# Patient Record
Sex: Male | Born: 1995 | Race: Black or African American | Hispanic: No | Marital: Single | Smoking: Current every day smoker
Health system: Southern US, Community
[De-identification: ages and names within clinical notes are randomized; demographics above are authoritative.]

## PROBLEM LIST (undated history)

## (undated) ENCOUNTER — Emergency Department (HOSPITAL_BASED_OUTPATIENT_CLINIC_OR_DEPARTMENT_OTHER): Admission: EM | Source: Home / Self Care

## (undated) DIAGNOSIS — F32A Depression, unspecified: Secondary | ICD-10-CM

## (undated) DIAGNOSIS — M9250 Juvenile osteochondrosis of tibia and fibula, unspecified leg: Secondary | ICD-10-CM

## (undated) DIAGNOSIS — M25569 Pain in unspecified knee: Secondary | ICD-10-CM

## (undated) DIAGNOSIS — F329 Major depressive disorder, single episode, unspecified: Secondary | ICD-10-CM

## (undated) DIAGNOSIS — F909 Attention-deficit hyperactivity disorder, unspecified type: Secondary | ICD-10-CM

## (undated) HISTORY — DX: Juvenile osteochondrosis of tibia and fibula, unspecified leg: M92.50

## (undated) HISTORY — DX: Major depressive disorder, single episode, unspecified: F32.9

## (undated) HISTORY — DX: Attention-deficit hyperactivity disorder, unspecified type: F90.9

## (undated) HISTORY — DX: Pain in unspecified knee: M25.569

## (undated) HISTORY — DX: Depression, unspecified: F32.A

---
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
## 2017-03-26 ENCOUNTER — Emergency Department (HOSPITAL_COMMUNITY)
Admission: EM | Admit: 2017-03-26 | Discharge: 2017-03-26 | Disposition: A | Discharge status: Home / Self Care | Payer: Medicaid Other | Attending: Emergency Medicine | Admitting: Emergency Medicine

## 2017-03-26 ENCOUNTER — Encounter (HOSPITAL_COMMUNITY): Payer: Self-pay | Admitting: *Deleted

## 2017-03-26 ENCOUNTER — Telehealth: Payer: Self-pay | Admitting: Internal Medicine

## 2017-03-26 DIAGNOSIS — F17200 Nicotine dependence, unspecified, uncomplicated: Secondary | ICD-10-CM | POA: Insufficient documentation

## 2017-03-26 DIAGNOSIS — R112 Nausea with vomiting, unspecified: Secondary | ICD-10-CM | POA: Diagnosis not present

## 2017-03-26 DIAGNOSIS — R197 Diarrhea, unspecified: Secondary | ICD-10-CM | POA: Insufficient documentation

## 2017-03-26 DIAGNOSIS — R109 Unspecified abdominal pain: Secondary | ICD-10-CM | POA: Diagnosis present

## 2017-03-26 LAB — COMPREHENSIVE METABOLIC PANEL
ALBUMIN: 4.6 g/dL (ref 3.5–5.0)
ALT: 15 U/L — AB (ref 17–63)
AST: 20 U/L (ref 15–41)
Alkaline Phosphatase: 47 U/L (ref 38–126)
Anion gap: 11 (ref 5–15)
BUN: 12 mg/dL (ref 6–20)
CHLORIDE: 103 mmol/L (ref 101–111)
CO2: 24 mmol/L (ref 22–32)
CREATININE: 1.08 mg/dL (ref 0.61–1.24)
Calcium: 9.6 mg/dL (ref 8.9–10.3)
GFR calc Af Amer: >60 mL/min (ref >60)
GLUCOSE: 107 mg/dL — AB (ref 65–99)
POTASSIUM: 3.5 mmol/L (ref 3.5–5.1)
SODIUM: 138 mmol/L (ref 135–145)
Total Bilirubin: 1.6 mg/dL — ABNORMAL HIGH (ref 0.3–1.2)
Total Protein: 7.9 g/dL (ref 6.5–8.1)

## 2017-03-26 LAB — URINALYSIS, ROUTINE W REFLEX MICROSCOPIC
Bilirubin Urine: NEGATIVE
GLUCOSE, UA: NEGATIVE mg/dL
HGB URINE DIPSTICK: NEGATIVE
KETONES UR: 5 mg/dL — AB
LEUKOCYTES UA: NEGATIVE
Nitrite: NEGATIVE
Protein, ur: NEGATIVE mg/dL
Specific Gravity, Urine: 1.028 (ref 1.005–1.030)
pH: 5 (ref 5.0–8.0)

## 2017-03-26 LAB — CBC
HEMATOCRIT: 46.7 % (ref 39.0–52.0)
Hemoglobin: 15.5 g/dL (ref 13.0–17.0)
MCH: 27.6 pg (ref 26.0–34.0)
MCHC: 33.2 g/dL (ref 30.0–36.0)
MCV: 83.2 fL (ref 78.0–100.0)
Platelets: 241 10*3/uL (ref 150–400)
RBC: 5.61 MIL/uL (ref 4.22–5.81)
RDW: 13.5 % (ref 11.5–15.5)
WBC: 10.2 10*3/uL (ref 4.0–10.5)

## 2017-03-26 LAB — LIPASE, BLOOD: LIPASE: 25 U/L (ref 11–51)

## 2017-03-26 MED ORDER — SODIUM CHLORIDE 0.9 % IV BOLUS (SEPSIS)
1000.0000 mL | Freq: Once | INTRAVENOUS | Status: AC
  Administered 2017-03-26: 1000 mL via INTRAVENOUS

## 2017-03-26 MED ORDER — ONDANSETRON HCL 4 MG PO TABS: 4.0000 mg | ORAL_TABLET | Freq: Three times a day (TID) | ORAL | 0 refills | Status: DC | PRN

## 2017-03-26 MED ORDER — ONDANSETRON HCL 4 MG/2ML IJ SOLN
4.0000 mg | Freq: Once | INTRAMUSCULAR | Status: AC
  Administered 2017-03-26: 4 mg via INTRAVENOUS
  Filled 2017-03-26: qty 2

## 2017-03-26 NOTE — Telephone Encounter (Signed)
Mother is calling because her son was seen in the ER early this morning. He was not given any type of medication for nausea when he left. He is still having the same symptoms and the mom wanted to know if we can call in something for this. jw

## 2017-03-26 NOTE — ED Triage Notes (Signed)
The pt is c/o abd pain nausea vomiting  Diarrhea all day.

## 2017-03-26 NOTE — Telephone Encounter (Signed)
Please let patient know I have sent in some Zofran to help with Nausea. Make sure patient is able to keep down fluids as this is most important. Patient can follow up with me as needed for this issue

## 2017-03-26 NOTE — ED Provider Notes (Signed)
[facility]-EMERGENCY DEPT Provider Note   CSN: [id number] Arrival date & time: 03/26/17  1914  By signing my name below, I, Rosana Fret, attest that this documentation has been prepared under the direction and in the presence of Melene Plan, DO. Electronically Signed: Rosana Fret, ED Scribe. 03/26/17. 2:22 AM.  History   Chief Complaint Chief Complaint  Patient presents with  . Abdominal Pain   The history is provided by the patient. No language interpreter was used.   HPI Comments: Jason Benjamin is an otherwise 21 y.o. male who presents to the Emergency Department complaining of generalized, moderate abdominal pain onset 16 hours ago. Pt describes pain as radiating to his mid back. Pt reports associated nausea, vomiting, diarrhea, subjective fever and chills. No treatments tried prior to arrival in the ED. Pt denies melena, blood in stool, dysuria, hematemesis or any other complaints at this time.  Past Medical History:  Diagnosis Date  . ADHD (attention deficit hyperactivity disorder)   . Depression   . Knee pain, acute 03/27/2011   Treated at [facility] with PT 7/5-9/19/12   . Osgood-Schlatter's disease 05/15/2011    Patient Active Problem List   Diagnosis Date Noted  . Viral syndrome 04/30/2014  . Cough 04/30/2014  . Foot callus 03/26/2014  . Dysuria 03/26/2014  . Lesion of right earlobe 10/23/2013  . Allergic rhinitis 01/22/2013  . Obesity 06/09/2012  . 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 Adjustment disorder with anxiety 11/25/2010  . Depressive disorder 09/09/2010  . 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 Microscopic hematuria 06/19/2008    History reviewed. No pertinent surgical history.     Home Medications    Prior to Admission medications   Medication Sig Start Date End Date Taking? Authorizing Provider  acyclovir (ZOVIRAX) 400 MG tablet Take 1  tablet (400 mg total) by mouth 3 (three) times daily. 12/24/16   Deatra Canter, FNP    Family History Family History  Problem Relation Age of Onset  . Hypertension Mother     Social History Social History  Substance Use Topics  . Smoking status: Current Every Day Smoker  . Smokeless tobacco: Never Used  . Alcohol use No     Allergies   Patient has no known allergies.   Review of Systems Review of Systems  Constitutional: Positive for chills and fever (subjective).  HENT: Negative for congestion and facial swelling.   Eyes: Negative for discharge and visual disturbance.  Respiratory: Negative for shortness of breath.   Cardiovascular: Negative for chest pain and palpitations.  Gastrointestinal: Positive for abdominal pain, diarrhea, nausea and vomiting. Negative for blood in stool.  Genitourinary: Negative for dysuria.  Musculoskeletal: Negative for arthralgias and myalgias.  Skin: Negative for color change and rash.  Neurological: Negative for tremors, syncope and headaches.  Psychiatric/Behavioral: Negative for confusion and dysphoric mood.     Physical Exam Updated Vital Signs BP 122/69   Pulse 82   Temp 98.6 F (37 C) (Oral)   Resp 16   Ht 6\' 5"  (1.956 m)   Wt 104.3 kg (230 lb)   SpO2 99%   BMI 27.27 kg/m   Physical Exam  Constitutional: He is oriented to person, place, and time. He appears well-developed and well-nourished.  HENT:  Head: Normocephalic and atraumatic.  Eyes: EOM are normal. Pupils are equal, round, and reactive to light.  Neck: Normal range of motion. Neck supple. No JVD present.  Cardiovascular: Normal rate and regular rhythm.  Exam reveals no gallop and no friction rub.   No murmur heard. Pulmonary/Chest: No respiratory distress. He has no wheezes.  Benign abdominal exam.   Abdominal: Soft. He exhibits no distension. There is no rebound and no guarding.  Musculoskeletal: Normal range of motion.  Neurological: He is alert and  oriented to person, place, and time.  Skin: No rash noted. No pallor.  Psychiatric: He has a normal mood and affect. His behavior is normal.  Nursing note and vitals reviewed.    ED Treatments / Results  DIAGNOSTIC STUDIES: Oxygen Saturation is 100% on RA, normal by my interpretation.   COORDINATION OF CARE: 2:19 AM-Discussed next steps with pt including medicine for nausea. Pt verbalized understanding and is agreeable with the plan.   Labs (all labs ordered are listed, but only abnormal results are displayed) Labs Reviewed  COMPREHENSIVE METABOLIC PANEL - Abnormal; Notable for the following:       Result Value   Glucose, Bld 107 (*)    ALT 15 (*)    Total Bilirubin 1.6 (*)    All other components within normal limits  URINALYSIS, ROUTINE W REFLEX MICROSCOPIC - Abnormal; Notable for the following:    Ketones, ur 5 (*)    All other components within normal limits  LIPASE, BLOOD  CBC    EKG  EKG Interpretation None       Radiology No results found.  Procedures Procedures (including critical care time)  Medications Ordered in ED Medications  sodium chloride 0.9 % bolus 1,000 mL (0 mLs Intravenous Stopped 03/26/17 0243)  ondansetron (ZOFRAN) injection 4 mg (4 mg Intravenous Given 03/26/17 0207)     Initial Impression / Assessment and Plan / ED Course  I have reviewed the triage vital signs and the nursing notes.  Pertinent labs & imaging results that were available during my care of the patient were reviewed by me and considered in my medical decision making (see chart for details).     21 yo M With a chief complaint of nausea vomiting and diarrhea. No abdominal tenderness on exam. Well-appearing and nontoxic. Labs are assured. Discharge home.  5:24 AM:  I have discussed the diagnosis/risks/treatment options with the patient and family and believe the pt to be eligible for discharge home to follow-up with PCP. We also discussed returning to the ED immediately if  new or worsening sx occur. We discussed the sx which are most concerning (e.g., sudden worsening pain, fever, inability to tolerate by mouth) that necessitate immediate return. Medications administered to the patient during their visit and any new prescriptions provided to the patient are listed below.  Medications given during this visit Medications  sodium chloride 0.9 % bolus 1,000 mL (0 mLs Intravenous Stopped 03/26/17 0243)  ondansetron (ZOFRAN) injection 4 mg (4 mg Intravenous Given 03/26/17 0207)     The patient appears reasonably screen and/or stabilized for discharge and I doubt any other medical condition or other [facility] requiring further screening, evaluation, or treatment in the ED at this time prior to discharge.    Final Clinical Impressions(s) / ED Diagnoses   Final diagnoses:  Nausea vomiting and diarrhea    New Prescriptions Discharge Medication List as of 03/26/2017  2:23 AM      I personally performed the services described in this documentation, which was scribed in my presence. The recorded information has been reviewed and is accurate.     Melene PlanFloyd, Jeson Camacho, DO 03/26/17 [phone]

## 2017-03-26 NOTE — Telephone Encounter (Signed)
Patient mother informed of message from MD, expressed understanding. 

## 2017-03-26 NOTE — Discharge Instructions (Signed)
Imodium for diarrhea

## 2017-05-23 ENCOUNTER — Emergency Department (HOSPITAL_COMMUNITY)
Admission: EM | Admit: 2017-05-23 | Discharge: 2017-05-24 | Disposition: A | Discharge status: Home / Self Care | Payer: Medicaid Other | Attending: Emergency Medicine | Admitting: Emergency Medicine

## 2017-05-23 ENCOUNTER — Encounter (HOSPITAL_COMMUNITY): Payer: Self-pay | Admitting: *Deleted

## 2017-05-23 ENCOUNTER — Emergency Department (HOSPITAL_COMMUNITY): Payer: Medicaid Other

## 2017-05-23 DIAGNOSIS — L03116 Cellulitis of left lower limb: Secondary | ICD-10-CM | POA: Insufficient documentation

## 2017-05-23 DIAGNOSIS — F17200 Nicotine dependence, unspecified, uncomplicated: Secondary | ICD-10-CM | POA: Insufficient documentation

## 2017-05-23 DIAGNOSIS — L02419 Cutaneous abscess of limb, unspecified: Secondary | ICD-10-CM

## 2017-05-23 DIAGNOSIS — L03119 Cellulitis of unspecified part of limb: Secondary | ICD-10-CM

## 2017-05-23 DIAGNOSIS — F902 Attention-deficit hyperactivity disorder, combined type: Secondary | ICD-10-CM | POA: Insufficient documentation

## 2017-05-23 DIAGNOSIS — Z79899 Other long term (current) drug therapy: Secondary | ICD-10-CM | POA: Diagnosis not present

## 2017-05-23 DIAGNOSIS — M25562 Pain in left knee: Secondary | ICD-10-CM

## 2017-05-23 MED ORDER — LIDOCAINE-EPINEPHRINE (PF) 2 %-1:200000 IJ SOLN
10.0000 mL | Freq: Once | INTRAMUSCULAR | Status: AC
  Administered 2017-05-23: 10 mL
  Filled 2017-05-23: qty 20

## 2017-05-23 MED ORDER — IBUPROFEN 800 MG PO TABS
800.0000 mg | ORAL_TABLET | Freq: Once | ORAL | Status: AC
  Administered 2017-05-23: 800 mg via ORAL
  Filled 2017-05-23: qty 1

## 2017-05-23 NOTE — ED Provider Notes (Signed)
[facility]-EMERGENCY DEPT Provider Note   CSN: [id number] Arrival date & time: 05/23/17  2107  By signing my name below, I, [name], attest that this documentation has been prepared under the direction and in the presence of Will Koralyn Prestage, PA-C. Electronically Signed: [name], Scribe. 05/23/2017. 11:06 PM.  History   Chief Complaint Chief Complaint  Patient presents with  . Leg Pain  . Abscess  . Knee Pain   The history is provided by the patient. No language interpreter was used.   HPI Comments: Jason KeysJayquan Benjamin is a 21 y.o. male who presents to the Emergency Department for evaluation of persistent anterior left knee pain beginning around 1130 AM this morning. The pain is worse with flexion and extension of the knee. No medications or treatments tried. No recent trauma or injury to his left knee. He has a history of tendonitis in his bilateral knees when he played football, but notes that he stopped playing after he was diagnosed. His current knee pain is completely different than previous pain related to tendonitis. Patient denies fevers, chills, numbness/tingling, focal weakness, calf pain, leg swelling, or any other associated symptoms.  He is secondarily complaining of a moderate, gradually worsening area of pain and swelling to his right lateral thigh ongoing for several days. The pain is worse with palpation. There are no alleviating factors noted. He has no h/o skin abscesses. He denies known injury or insect bite. His tetanus status is UTD. Patient denies spontaneous drainage from the area or any other associated symptoms.  Past Medical History:  Diagnosis Date  . ADHD (attention deficit hyperactivity disorder)   . Depression   . Knee pain, acute 03/27/2011   Treated at [facility] with PT 7/5-9/19/12   . Osgood-Schlatter's disease 05/15/2011    Patient Active Problem List   Diagnosis Date Noted  . Viral syndrome 04/30/2014  . Cough 04/30/2014  . Foot callus  03/26/2014  . Dysuria 03/26/2014  . Lesion of right earlobe 10/23/2013  . Allergic rhinitis 01/22/2013  . Obesity 06/09/2012  . 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 Adjustment disorder with anxiety 11/25/2010  . Depressive disorder 09/09/2010  . 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 Microscopic hematuria 06/19/2008    History reviewed. No pertinent surgical history.     Home Medications    Prior to Admission medications   Medication Sig Start Date End Date Taking? Authorizing Provider  acyclovir (ZOVIRAX) 400 MG tablet Take 1 tablet (400 mg total) by mouth 3 (three) times daily. 12/24/16   Deatra Canterxford, Tobey Schmelzle J, FNP  cephALEXin (KEFLEX) 500 MG capsule Take 1 capsule (500 mg total) by mouth 2 (two) times daily. 05/24/17   Everlene Farrieransie, Christol Thetford, PA-C  naproxen (NAPROSYN) 250 MG tablet Take 1 tablet (250 mg total) by mouth 2 (two) times daily with a meal. 05/24/17   Everlene Farrieransie, Garrit Marrow, PA-C  ondansetron (ZOFRAN) 4 MG tablet Take 1 tablet (4 mg total) by mouth every 8 (eight) hours as needed for nausea or vomiting. 03/26/17   Cathlean CowerMikell, Antionette PolesAsiyah Zahra, MD    Family History Family History  Problem Relation Age of Onset  . Hypertension Mother     Social History Social History  Substance Use Topics  . Smoking status: Current Every Day Smoker  . Smokeless tobacco: Never Used  . Alcohol use No     Allergies   Patient has no known allergies.   Review of Systems Review of Systems  Constitutional: Negative for chills and fever.  Cardiovascular: Negative for leg swelling.  Musculoskeletal: Positive for arthralgias. Negative for back pain, joint swelling and myalgias.  Skin: Positive for color change and rash.  Neurological: Negative for weakness and numbness.   Physical Exam Updated Vital Signs BP 133/70 (BP Location: Left Arm)   Pulse 88   Temp 98.8 F (37.1 C) (Oral)   Resp 18   Ht 6'  5" (1.956 m)   Wt 104.3 kg (230 lb)   SpO2 100%   BMI 27.27 kg/m   Physical Exam  Constitutional: He appears well-developed and well-nourished. No distress.  Nontoxic-appearing.  HENT:  Head: Normocephalic and atraumatic.  Eyes: Right eye exhibits no discharge. Left eye exhibits no discharge.  Neck: Neck supple.  Cardiovascular: Normal rate, regular rhythm, normal heart sounds and intact distal pulses.   Bilateral dorsalis pedis and posterior tibialis pulses are intact.  Pulmonary/Chest: Effort normal and breath sounds normal. No respiratory distress.  Abdominal: Soft. There is no tenderness.  Musculoskeletal: Normal range of motion. He exhibits tenderness. He exhibits no edema or deformity.  Patient has tenderness diffusely to his left anterior knee. No joint effusion noted. No deformity noted. No calf edema or tenderness. He is neurovascularly intact. Good strength with plantar and dorsiflexion bilaterally.  Lymphadenopathy:    He has no cervical adenopathy.  Neurological: He is alert. No sensory deficit. Coordination normal.  Sensation is intact in his bilateral lower extremities.  Skin: Skin is warm and dry. Capillary refill takes less than 2 seconds. No rash noted. He is not diaphoretic. There is erythema. No pallor.  4 cm area of induration and fluctuance noted to his right lateral upper thigh with overlying erythema. No fluctuance. No active discharge.   Psychiatric: He has a normal mood and affect. His behavior is normal.  Nursing note and vitals reviewed.  ED Treatments / Results  Labs (all labs ordered are listed, but only abnormal results are displayed) Labs Reviewed - No data to display  EKG  EKG Interpretation None       Radiology Dg Knee Complete 4 Views Left  Result Date: 05/23/2017 CLINICAL DATA:  Acute onset of left anterior knee pain. Initial encounter. EXAM: LEFT KNEE - COMPLETE 4+ VIEW COMPARISON:  None. FINDINGS: There is no evidence of fracture or  dislocation. The joint spaces are preserved. No significant degenerative change is seen; the patellofemoral joint is grossly unremarkable in appearance. No significant joint effusion is seen. The visualized soft tissues are normal in appearance. IMPRESSION: No evidence of fracture or dislocation. Electronically Signed   By: Roanna Raider M.D.   On: 05/23/2017 23:56    Procedures .Marland KitchenIncision and Drainage Date/Time: 05/24/2017 12:13 AM Performed by: Everlene Farrier Authorized by: Everlene Farrier   Consent:    Consent obtained:  Verbal   Consent given by:  Patient   Risks discussed:  Bleeding, incomplete drainage, pain and infection Location:    Type:  Abscess   Size:  Large 4 cm    Location:  Lower extremity   Lower extremity location:  Leg   Leg location:  R upper leg Pre-procedure details:    Skin preparation:  Betadine Anesthesia (see MAR for exact dosages):    Anesthesia method:  Local infiltration   Local anesthetic:  Lidocaine 2% WITH epi Procedure type:    Complexity:  Complex Procedure details:    Incision types:  Single straight   Incision depth:  Dermal   Scalpel blade:  11  Wound management:  Probed and deloculated, irrigated with saline and extensive cleaning   Drainage:  Purulent   Drainage amount:  Copious   Wound treatment:  Wound left open   Packing materials:  None Post-procedure details:    Patient tolerance of procedure:  Tolerated well, no immediate complications   (including critical care time)  EMERGENCY DEPARTMENT US SOFT TISSUE INTERPRETATION "Study: Limited Soft Tissue Ultrasound"  INDICATIONS: Soft tissue infection Multiple views of the body part were obtained in real-time with a multi-frequency linear probe  PERFORMED BY: Myself IMAGES ARCHIVED?: Yes SIDE:Right  BODY PART:Lower extremity INTERPRETATION:  Abcess present and Cellulitis present     DIAGNOSTIC STUDIES: Oxygen Saturation is 100% on RA, normal by my interpretation.     COORDINATION OF CARE: 11:05 PM Discussed treatment plan with pt at bedside and pt agreed to plan.  Medications Ordered in ED Medications  ibuprofen (ADVIL,MOTRIN) tablet 800 mg (800 mg Oral Given 05/23/17 2310)  lidocaine-EPINEPHrine (XYLOCAINE W/EPI) 2 %-1:200000 (PF) injection 10 mL (10 mLs Infiltration Given 05/23/17 2311)     Initial Impression / Assessment and Plan / ED Course  I have reviewed the triage vital signs and the nursing notes.  Pertinent labs & imaging results that were available during my care of the patient were reviewed by me and considered in my medical decision making (see chart for details).    This is a 21 y.o. male who presents to the Emergency Department for evaluation of persistent anterior left knee pain beginning around 1130 AM this morning. The pain is worse with flexion and extension of the knee. No medications or treatments tried. No recent trauma or injury to his left knee. He has a history of tendonitis in his bilateral knees when he played football, but notes that he stopped playing after he was diagnosed. His current knee pain is completely different than previous pain related to tendonitis. Patient denies fevers, chills, numbness/tingling, focal weakness, or any other associated symptoms.  He is secondarily complaining of a moderate, gradually worsening area of pain and swelling to his right lateral thigh ongoing for several days. The pain is worse with palpation. There are no alleviating factors noted. He has no h/o skin abscesses. He denies known injury or insect bite. His tetanus status is UTD.  On exam patient is afebrile nontoxic appearing. He has tenderness diffusely to the anterior aspect of his left knee. No joint effusion. He is neurovascularly intact. No lower extremity edema noted. X-ray is unremarkable. Will place the patient in the sleeve and provide with crutches. We'll have him follow up with sports medicine clinic.  Patient also has large  fluctuant and indurated abscess noted to his right lateral upper thigh. Ultrasound reveals cellulitis and abscess present. Incision and drainage performed and tolerated well by the patient. A copious amount of drainage was obtained. Wound care instructions provided. As the patient has evidence of cellulitis on ultrasound patient will be provided with Keflex. Naproxen for pain control.  Strict and specific return precautions discussed. I advised the patient to follow-up with their primary care provider this week. I advised the patient to return to the emergency department with new or worsening symptoms or new concerns. The patient verbalized understanding and agreement with plan.     Final Clinical Impressions(s) / ED Diagnoses   Final diagnoses:  Cellulitis and abscess of leg  Acute pain of left knee    New Prescriptions New Prescriptions   CEPHALEXIN (KEFLEX) 500 MG CAPSULE    Take  1 capsule (500 mg total) by mouth 2 (two) times daily.   NAPROXEN (NAPROSYN) 250 MG TABLET    Take 1 tablet (250 mg total) by mouth 2 (two) times daily with a meal.    I personally performed the services described in this documentation, which was scribed in my presence. The recorded information has been reviewed and is accurate.      Everlene Farrier, PA-C 05/24/17 0101    Clarene Duke Ambrose Finland, MD 05/26/17 [phone]

## 2017-05-23 NOTE — ED Triage Notes (Signed)
Pt reports L leg pain onset at 11:30am, denies injury to L leg. Also c/o insect bite to R thigh.

## 2017-05-24 MED ORDER — NAPROXEN 250 MG PO TABS: 250.0000 mg | ORAL_TABLET | Freq: Two times a day (BID) | ORAL | 0 refills | Status: DC

## 2017-05-24 MED ORDER — CEPHALEXIN 500 MG PO CAPS: 500.0000 mg | ORAL_CAPSULE | Freq: Two times a day (BID) | ORAL | 0 refills | Status: DC

## 2017-05-24 NOTE — ED Notes (Signed)
Ortho tech notified of pt's need for knee sleeve and crutches.

## 2017-05-24 NOTE — Progress Notes (Signed)
Orthopedic Tech Progress Note Patient Details:  Jason KeysJayquan Benjamin 12/16/1995 [id number]  Ortho Devices Type of Ortho Device: Crutches, Knee Sleeve Ortho Device/Splint Location: lle Ortho Device/Splint Interventions: Ordered, Application, Adjustment   Trinna PostMartinez, Jaramie Bastos J 05/24/2017, 1:13 AM

## 2018-11-10 ENCOUNTER — Emergency Department (HOSPITAL_COMMUNITY)
Admission: EM | Admit: 2018-11-10 | Discharge: 2018-11-10 | Disposition: A | Discharge status: Home / Self Care | Payer: Self-pay | Attending: Emergency Medicine | Admitting: Emergency Medicine

## 2018-11-10 ENCOUNTER — Encounter (HOSPITAL_COMMUNITY): Payer: Self-pay | Admitting: *Deleted

## 2018-11-10 ENCOUNTER — Other Ambulatory Visit: Payer: Self-pay

## 2018-11-10 DIAGNOSIS — F17210 Nicotine dependence, cigarettes, uncomplicated: Secondary | ICD-10-CM | POA: Insufficient documentation

## 2018-11-10 DIAGNOSIS — F909 Attention-deficit hyperactivity disorder, unspecified type: Secondary | ICD-10-CM | POA: Insufficient documentation

## 2018-11-10 DIAGNOSIS — J101 Influenza due to other identified influenza virus with other respiratory manifestations: Secondary | ICD-10-CM | POA: Insufficient documentation

## 2018-11-10 DIAGNOSIS — Z79899 Other long term (current) drug therapy: Secondary | ICD-10-CM | POA: Insufficient documentation

## 2018-11-10 DIAGNOSIS — R69 Illness, unspecified: Secondary | ICD-10-CM

## 2018-11-10 DIAGNOSIS — J111 Influenza due to unidentified influenza virus with other respiratory manifestations: Secondary | ICD-10-CM

## 2018-11-10 MED ORDER — IBUPROFEN 800 MG PO TABS
800.0000 mg | ORAL_TABLET | Freq: Once | ORAL | Status: AC
  Administered 2018-11-10: 800 mg via ORAL
  Filled 2018-11-10: qty 1

## 2018-11-10 MED ORDER — PROMETHAZINE HCL 25 MG PO TABS: 25.0000 mg | ORAL_TABLET | Freq: Three times a day (TID) | ORAL | 0 refills | Status: DC | PRN

## 2018-11-10 MED ORDER — BENZONATATE 100 MG PO CAPS: 100.0000 mg | ORAL_CAPSULE | Freq: Three times a day (TID) | ORAL | 0 refills | Status: DC | PRN

## 2018-11-10 NOTE — ED Provider Notes (Signed)
[facility]-EMERGENCY DEPT Provider Note   CSN: [id number] Arrival date & time: 11/10/18  2110     History   Chief Complaint Chief Complaint  Patient presents with  . Fever  . Generalized Body Aches    HPI Jason Benjamin is a 23 y.o. male.  The history is provided by the patient and a parent. No language interpreter was used.  Fever   Jason Benjamin is a 23 y.o. male who presents to the Emergency Department complaining of fever. He presents to the emergency department complaining of fever, body aches, sore throat, cough, nausea, vomiting. Symptoms started two days ago with body aches, sore throat followed by cough, runny nose, sneezing. Today he developed headache, nausea, vomiting. He took Tylenol at home with no significant improvement in his symptoms. He denies any abdominal pain, dysuria, constipation. He has no medical problems and takes no medications. No known sick contacts. Past Medical History:  Diagnosis Date  . ADHD (attention deficit hyperactivity disorder)   . Depression   . Knee pain, acute 03/27/2011   Treated at [facility] with PT 7/5-9/19/12   . Osgood-Schlatter's disease 05/15/2011    Patient Active Problem List   Diagnosis Date Noted  . Viral syndrome 04/30/2014  . Cough 04/30/2014  . Foot callus 03/26/2014  . Dysuria 03/26/2014  . Lesion of right earlobe 10/23/2013  . Allergic rhinitis 01/22/2013  . Obesity 06/09/2012  . 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 Adjustment disorder with anxiety 11/25/2010  . Depressive disorder 09/09/2010  . 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 Microscopic hematuria 06/19/2008    History reviewed. No pertinent surgical history.      Home Medications    Prior to Admission medications   Medication Sig Start Date End Date Taking? Authorizing Provider  acyclovir (ZOVIRAX) 400 MG tablet Take  1 tablet (400 mg total) by mouth 3 (three) times daily. 12/24/16   Deatra Canter, FNP  cephALEXin (KEFLEX) 500 MG capsule Take 1 capsule (500 mg total) by mouth 2 (two) times daily. 05/24/17   Everlene Farrier, PA-C  naproxen (NAPROSYN) 250 MG tablet Take 1 tablet (250 mg total) by mouth 2 (two) times daily with a meal. 05/24/17   Everlene Farrier, PA-C  ondansetron (ZOFRAN) 4 MG tablet Take 1 tablet (4 mg total) by mouth every 8 (eight) hours as needed for nausea or vomiting. 03/26/17   Cathlean Cower, Antionette Poles, MD    Family History Family History  Problem Relation Age of Onset  . Hypertension Mother     Social History Social History   Tobacco Use  . Smoking status: Current Every Day Smoker  . Smokeless tobacco: Never Used  Substance Use Topics  . Alcohol use: No  . Drug use: Yes    Types: Marijuana     Allergies   Patient has no known allergies.   Review of Systems Review of Systems  Constitutional: Positive for fever.  All other systems reviewed and are negative.    Physical Exam Updated Vital Signs BP 113/64 (BP Location: Left Arm)   Pulse 98   Temp (!) 104.2 F (40.1 C) (Oral)   Resp 18   Ht 6\' 5"  (1.956 m)   Wt 90.7 kg   SpO2 98%   BMI 23.72 kg/m   Physical Exam Vitals signs and nursing note reviewed.  Constitutional:      Appearance: He is well-developed.  HENT:  Head: Normocephalic and atraumatic.     Comments: Mild erythema in the posterior oropharynx without any significant edema or exudates Neck:     Musculoskeletal: Neck supple.  Cardiovascular:     Rate and Rhythm: Normal rate and regular rhythm.     Heart sounds: No murmur.  Pulmonary:     Effort: Pulmonary effort is normal. No respiratory distress.     Breath sounds: Normal breath sounds.  Abdominal:     Palpations: Abdomen is soft.     Tenderness: There is no abdominal tenderness. There is no guarding or rebound.  Musculoskeletal:        General: No swelling or tenderness.  Skin:     General: Skin is warm and dry.  Neurological:     General: No focal deficit present.     Mental Status: He is alert and oriented to person, place, and time.  Psychiatric:        Mood and Affect: Mood normal.        Behavior: Behavior normal.      ED Treatments / Results  Labs (all labs ordered are listed, but only abnormal results are displayed) Labs Reviewed - No data to display  EKG None  Radiology No results found.  Procedures Procedures (including critical care time)  Medications Ordered in ED Medications  ibuprofen (ADVIL,MOTRIN) tablet 800 mg (has no administration in time range)     Initial Impression / Assessment and Plan / ED Course  I have reviewed the triage vital signs and the nursing notes.  Pertinent labs & imaging results that were available during my care of the patient were reviewed by me and considered in my medical decision making (see chart for details).     Patient here for for evaluation of sore throat, coughing, headache, body aches, fevers, nausea, vomiting. He is non-toxic appearing on evaluation with no respiratory distress. Presentation is not consistent with RPA, PTA, epiglotitis, meningitis, pneumonia. Discussed with patient likely influenza or viral illness. Discussed home care with oral fluid hydration, fever control. Discussed return precautions.  Final Clinical Impressions(s) / ED Diagnoses   Final diagnoses:  None    ED Discharge Orders    None       Tilden Fossa, MD 11/10/18 2233

## 2018-11-10 NOTE — ED Triage Notes (Addendum)
Per [facility], pt c/o flu-like symptoms that started x 2 days ago.  Generalized pain, vomiting started today.  Tylenol 1,000 mg po & Zofran 4 mg IV given PTA by [facility].

## 2018-11-18 ENCOUNTER — Other Ambulatory Visit: Payer: Self-pay

## 2018-11-18 ENCOUNTER — Encounter (HOSPITAL_COMMUNITY): Payer: Self-pay

## 2018-11-18 DIAGNOSIS — F17210 Nicotine dependence, cigarettes, uncomplicated: Secondary | ICD-10-CM | POA: Insufficient documentation

## 2018-11-18 DIAGNOSIS — J209 Acute bronchitis, unspecified: Secondary | ICD-10-CM | POA: Insufficient documentation

## 2018-11-18 NOTE — ED Triage Notes (Signed)
Pt reports continued flu-like symptoms from last week. He states that he finished the phenergan and tessalon that he was given, but they didn't help. He states, in clear uninterrupted sentences, that he is constantly coughing.  States that chest is sore. No distress noted. Pt upset that he was asked to wait in the lobby.

## 2018-11-19 ENCOUNTER — Emergency Department (HOSPITAL_COMMUNITY)
Admission: EM | Admit: 2018-11-19 | Discharge: 2018-11-19 | Disposition: A | Discharge status: Home / Self Care | Payer: Medicaid Other | Attending: Emergency Medicine | Admitting: Emergency Medicine

## 2018-11-19 DIAGNOSIS — J209 Acute bronchitis, unspecified: Secondary | ICD-10-CM

## 2018-11-19 MED ORDER — ONDANSETRON 8 MG PO TBDP: ORAL_TABLET | ORAL | 0 refills | Status: DC

## 2018-11-19 MED ORDER — AZITHROMYCIN 250 MG PO TABS
500.0000 mg | ORAL_TABLET | Freq: Once | ORAL | Status: AC
  Administered 2018-11-19: 500 mg via ORAL
  Filled 2018-11-19: qty 2

## 2018-11-19 MED ORDER — AZITHROMYCIN 250 MG PO TABS: 250.0000 mg | ORAL_TABLET | Freq: Every day | ORAL | 0 refills | Status: DC

## 2018-11-19 NOTE — Discharge Instructions (Addendum)
Zithromax as prescribed.  Zofran as prescribed as needed for nausea.  Tylenol 1000 mg rotated with ibuprofen 600 mg every 4 hours as needed for pain or fever.  Drink plenty of fluids and get plenty of rest.

## 2018-11-19 NOTE — ED Provider Notes (Signed)
[facility]-EMERGENCY DEPT Provider Note   CSN: [id number] Arrival date & time: 11/18/18  2206     History   Chief Complaint Chief Complaint  Patient presents with  . Cough  . Emesis    HPI Jason Benjamin is a 23 y.o. male.  Patient is a 23 year old male with past medical history of ADHD presenting with complaints of cough.  He has had URI symptoms for the past week and a half.  He was seen here this past weekend and told that he likely had the flu.  He was given Phenergan and was discharged.  He has now had these symptoms for 10 days and are not improving, if anything he feels worse.  He continues to have episodes of vomiting and also reports coughing up green sputum.  He denies any chest pain or difficulty breathing.  The history is provided by the patient.  Cough  Cough characteristics:  Productive Sputum characteristics:  Green Severity:  Moderate Onset quality:  Gradual Duration:  10 days Timing:  Constant Progression:  Worsening Chronicity:  New Relieved by:  Nothing Worsened by:  Nothing Emesis  Associated symptoms: cough     Past Medical History:  Diagnosis Date  . ADHD (attention deficit hyperactivity disorder)   . Depression   . Knee pain, acute 03/27/2011   Treated at [facility] with PT 7/5-9/19/12   . Osgood-Schlatter's disease 05/15/2011    Patient Active Problem List   Diagnosis Date Noted  . Viral syndrome 04/30/2014  . Cough 04/30/2014  . Foot callus 03/26/2014  . Dysuria 03/26/2014  . Lesion of right earlobe 10/23/2013  . Allergic rhinitis 01/22/2013  . Obesity 06/09/2012  . 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 Adjustment disorder with anxiety 11/25/2010  . Depressive disorder 09/09/2010  . 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 Microscopic hematuria 06/19/2008    History reviewed. No pertinent surgical  history.      Home Medications    Prior to Admission medications   Medication Sig Start Date End Date Taking? Authorizing Provider  acetaminophen (TYLENOL) 500 MG tablet Take 500 mg by mouth every 6 (six) hours as needed for moderate pain.    [provider]  acyclovir (ZOVIRAX) 400 MG tablet Take 1 tablet (400 mg total) by mouth 3 (three) times daily. Patient not taking: Reported on 11/10/2018 12/24/16   Deatra Canter, FNP  benzonatate (TESSALON) 100 MG capsule Take 1 capsule (100 mg total) by mouth 3 (three) times daily as needed for cough. 11/10/18   Tilden Fossa, MD  cephALEXin (KEFLEX) 500 MG capsule Take 1 capsule (500 mg total) by mouth 2 (two) times daily. Patient not taking: Reported on 11/10/2018 05/24/17   Everlene Farrier, PA-C  naproxen (NAPROSYN) 250 MG tablet Take 1 tablet (250 mg total) by mouth 2 (two) times daily with a meal. Patient not taking: Reported on 11/10/2018 05/24/17   Everlene Farrier, PA-C  ondansetron (ZOFRAN) 4 MG tablet Take 1 tablet (4 mg total) by mouth every 8 (eight) hours as needed for nausea or vomiting. Patient not taking: Reported on 11/10/2018 03/26/17   Berton Bon, MD  promethazine (PHENERGAN) 25 MG tablet Take 1 tablet (25 mg total) by mouth every 8 (eight) hours as needed for nausea or vomiting. 11/10/18   Tilden Fossa, MD    Family History Family History  Problem Relation Age of Onset  . Hypertension Mother  Social History Social History   Tobacco Use  . Smoking status: Current Every Day Smoker  . Smokeless tobacco: Never Used  Substance Use Topics  . Alcohol use: No  . Drug use: Yes    Types: Marijuana     Allergies   Patient has no known allergies.   Review of Systems Review of Systems  Respiratory: Positive for cough.   Gastrointestinal: Positive for vomiting.  All other systems reviewed and are negative.    Physical Exam Updated Vital Signs BP 129/67 (BP Location: Right Arm)   Pulse (!) 59    Temp 99.3 F (37.4 C) (Oral)   Resp 17   Ht 6\' 5"  (1.956 m)   SpO2 98%   BMI 23.72 kg/m   Physical Exam Vitals signs and nursing note reviewed.  Constitutional:      General: He is not in acute distress.    Appearance: He is well-developed. He is not diaphoretic.  HENT:     Head: Normocephalic and atraumatic.     Mouth/Throat:     Mouth: Mucous membranes are moist.     Pharynx: No oropharyngeal exudate or posterior oropharyngeal erythema.  Neck:     Musculoskeletal: Normal range of motion and neck supple.  Cardiovascular:     Rate and Rhythm: Normal rate and regular rhythm.     Heart sounds: No murmur. No friction rub.  Pulmonary:     Effort: Pulmonary effort is normal. No respiratory distress.     Breath sounds: Normal breath sounds. No wheezing or rales.  Abdominal:     General: Bowel sounds are normal. There is no distension.     Palpations: Abdomen is soft.     Tenderness: There is no abdominal tenderness.  Musculoskeletal: Normal range of motion.  Skin:    General: Skin is warm and dry.  Neurological:     Mental Status: He is alert and oriented to person, place, and time.     Coordination: Coordination normal.      ED Treatments / Results  Labs (all labs ordered are listed, but only abnormal results are displayed) Labs Reviewed - No data to display  EKG None  Radiology No results found.  Procedures Procedures (including critical care time)  Medications Ordered in ED Medications - No data to display   Initial Impression / Assessment and Plan / ED Course  I have reviewed the triage vital signs and the nursing notes.  Pertinent labs & imaging results that were available during my care of the patient were reviewed by me and considered in my medical decision making (see chart for details).  Patient with prolonged URI symptoms initially thought to be influenza.  He continues to cough up green sputum despite time and over-the-counter medications.  Patient  will be discharged with Zithromax for presumed bronchitis.  He will also be given Zofran he can take as needed for nausea.  He is to drink plenty of fluids, get plenty of rest, and follow-up with his primary doctor if not improving.  Final Clinical Impressions(s) / ED Diagnoses   Final diagnoses:  None    ED Discharge Orders    None       Geoffery Lyons, MD 11/19/18 [phone]

## 2019-03-27 ENCOUNTER — Emergency Department (HOSPITAL_COMMUNITY): Payer: Self-pay

## 2019-03-27 ENCOUNTER — Encounter (HOSPITAL_COMMUNITY): Payer: Self-pay

## 2019-03-27 ENCOUNTER — Emergency Department (HOSPITAL_COMMUNITY)
Admission: EM | Admit: 2019-03-27 | Discharge: 2019-03-27 | Disposition: A | Discharge status: Home / Self Care | Payer: Self-pay | Attending: Emergency Medicine | Admitting: Emergency Medicine

## 2019-03-27 DIAGNOSIS — F17200 Nicotine dependence, unspecified, uncomplicated: Secondary | ICD-10-CM | POA: Insufficient documentation

## 2019-03-27 DIAGNOSIS — Z79899 Other long term (current) drug therapy: Secondary | ICD-10-CM | POA: Insufficient documentation

## 2019-03-27 DIAGNOSIS — A084 Viral intestinal infection, unspecified: Secondary | ICD-10-CM | POA: Insufficient documentation

## 2019-03-27 LAB — URINALYSIS, ROUTINE W REFLEX MICROSCOPIC
Bacteria, UA: NONE SEEN
Bilirubin Urine: NEGATIVE
Glucose, UA: NEGATIVE mg/dL
Hgb urine dipstick: NEGATIVE
Ketones, ur: NEGATIVE mg/dL
Leukocytes,Ua: NEGATIVE
Nitrite: NEGATIVE
Protein, ur: 30 mg/dL — AB
Specific Gravity, Urine: >1.046 — ABNORMAL HIGH (ref 1.005–1.030)
pH: 8 (ref 5.0–8.0)

## 2019-03-27 LAB — COMPREHENSIVE METABOLIC PANEL
ALT: 20 U/L (ref 0–44)
AST: 29 U/L (ref 15–41)
Albumin: 5 g/dL (ref 3.5–5.0)
Alkaline Phosphatase: 54 U/L (ref 38–126)
Anion gap: 11 (ref 5–15)
BUN: 9 mg/dL (ref 6–20)
CO2: 27 mmol/L (ref 22–32)
Calcium: 10.3 mg/dL (ref 8.9–10.3)
Chloride: 105 mmol/L (ref 98–111)
Creatinine, Ser: 1.06 mg/dL (ref 0.61–1.24)
GFR calc Af Amer: >60 mL/min (ref >60)
GFR calc non Af Amer: >60 mL/min (ref >60)
Glucose, Bld: 113 mg/dL — ABNORMAL HIGH (ref 70–99)
Potassium: 3.8 mmol/L (ref 3.5–5.1)
Sodium: 143 mmol/L (ref 135–145)
Total Bilirubin: 1 mg/dL (ref 0.3–1.2)
Total Protein: 9.1 g/dL — ABNORMAL HIGH (ref 6.5–8.1)

## 2019-03-27 LAB — CBC
HCT: 47.4 % (ref 39.0–52.0)
Hemoglobin: 15.4 g/dL (ref 13.0–17.0)
MCH: 27.3 pg (ref 26.0–34.0)
MCHC: 32.5 g/dL (ref 30.0–36.0)
MCV: 84 fL (ref 80.0–100.0)
Platelets: 254 10*3/uL (ref 150–400)
RBC: 5.64 MIL/uL (ref 4.22–5.81)
RDW: 12.4 % (ref 11.5–15.5)
WBC: 6.2 10*3/uL (ref 4.0–10.5)
nRBC: 0 % (ref 0.0–0.2)

## 2019-03-27 LAB — LIPASE, BLOOD: Lipase: 37 U/L (ref 11–51)

## 2019-03-27 MED ORDER — ONDANSETRON 4 MG PO TBDP: 4.0000 mg | ORAL_TABLET | Freq: Three times a day (TID) | ORAL | 0 refills | Status: DC | PRN

## 2019-03-27 MED ORDER — ONDANSETRON 4 MG PO TBDP
4.0000 mg | ORAL_TABLET | Freq: Once | ORAL | Status: AC
  Administered 2019-03-27: 4 mg via ORAL
  Filled 2019-03-27: qty 1

## 2019-03-27 MED ORDER — SODIUM CHLORIDE 0.9 % IV BOLUS
1000.0000 mL | Freq: Once | INTRAVENOUS | Status: AC
  Administered 2019-03-27: 1000 mL via INTRAVENOUS

## 2019-03-27 MED ORDER — SODIUM CHLORIDE (PF) 0.9 % IJ SOLN
INTRAMUSCULAR | Status: DC
Start: 2019-03-27 — End: 2019-03-28
  Filled 2019-03-27: qty 50

## 2019-03-27 MED ORDER — IOHEXOL 300 MG/ML  SOLN
100.0000 mL | Freq: Once | INTRAMUSCULAR | Status: AC | PRN
  Administered 2019-03-27: 100 mL via INTRAVENOUS

## 2019-03-27 MED ORDER — SODIUM CHLORIDE 0.9% FLUSH: 3.0000 mL | Freq: Once | INTRAVENOUS | Status: DC

## 2019-03-27 NOTE — ED Triage Notes (Signed)
Patient c/o N/V diarrhea and generalized abdominal pain X2 days.   9/10 dull pain.   4 occurrences of emesis in past 24 hours.  3 occurrences of diarrhea in past 24 hours.   A/Ox4 Ambulatory in triage.   Temp-98.7

## 2019-03-27 NOTE — ED Triage Notes (Signed)
Pt BIBA from home. Pt states diarrhea and vomiting x 2 days. No blood noted. Bile in emesis

## 2019-03-27 NOTE — Discharge Instructions (Signed)
You have been diagnosed today with gastroenteritis  At this time there does not appear to be the presence of an emergent medical condition, however there is always the potential for conditions to change. Please read and follow the below instructions.  Please return to the Emergency Department immediately for any new or worsening symptoms or if your symptoms do not improve within 3 days. Please be sure to follow up with your Primary Care Provider within one week regarding your visit today; please call their office to schedule an appointment even if you are feeling better for a follow-up visit. You may use the medication Zofran as prescribed to help with nausea and vomiting.  Get help right away if: You have chest pain. You feel very weak or you pass out (faint). You see blood in your throw-up. Your throw-up looks like coffee grounds. You have bloody or black poop (stools) or poop that look like tar. You have a very bad headache, a stiff neck, or both. You have a rash. You have very bad pain, cramping, or bloating in your belly (abdomen). You have trouble breathing. You are breathing very quickly. Your heart is beating very quickly. Your skin feels cold and clammy. You feel confused. You have pain when you pee. You have signs of dehydration, such as: Dark pee, hardly any pee, or no pee. Cracked lips. Dry mouth. Sunken eyes. Sleepiness. Weakness.  Please read the additional information packets attached to your discharge summary.  Do not take your medicine if  develop an itchy rash, swelling in your mouth or lips, or difficulty breathing; call 911 and seek immediate emergency medical attention if this occurs.

## 2019-03-27 NOTE — ED Notes (Signed)
Pt stating he kept his crackers down and is feeling better

## 2019-03-27 NOTE — ED Notes (Signed)
Pt given ginger ale and saltines.  

## 2019-03-27 NOTE — ED Notes (Signed)
Spoke to lab about delay in urine sample results, stating they spoke to someone else earlier and informed that the urine sample was too old to process. PA made aware.

## 2019-03-27 NOTE — ED Provider Notes (Signed)
[facility] Note   CSN: [id number] Arrival date & time: 03/27/19  1147    History   Chief Complaint Chief Complaint  Patient presents with  . Abdominal Pain  . Emesis  . Diarrhea    HPI Jason Benjamin is a 23 y.o. male presented today for a 2-day history of generalized abdominal pain that he describes as an aching throb constant moderate intensity worsened with nausea vomiting and diarrhea and without alleviating factors.  Patient reports that over the past 24 hours he has had multiple episodes of nonbloody/nonbilious emesis and 3 episodes of nonbloody, watery diarrhea.  Denies fever/chills, cough, chest pain/shortness of breath, new foods, alcohol use, sick contacts, dysuria/hematuria, testicular pain/swelling, no discharge or any additional concerns.     HPI  Past Medical History:  Diagnosis Date  . ADHD (attention deficit hyperactivity disorder)   . Depression   . Knee pain, acute 03/27/2011   [facility] 7/5-9/19/12   . Osgood-Schlatter's disease 05/15/2011    Patient Active Problem List   Diagnosis Date Noted  . Viral syndrome 04/30/2014  . Cough 04/30/2014  . Foot callus 03/26/2014  . Dysuria 03/26/2014  . Lesion of right earlobe 10/23/2013  . Allergic rhinitis 01/22/2013  . Obesity 06/09/2012  . 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 Adjustment disorder with anxiety 11/25/2010  . Depressive disorder 09/09/2010  . 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 Microscopic hematuria 06/19/2008    History reviewed. No pertinent surgical history.      Home Medications    Prior to Admission medications   Medication Sig Start Date End Date Taking? Authorizing Provider  Ascorbic Acid (VITAMIN C PO) Take 2 tablets by mouth daily.   Yes [provider]  acyclovir (ZOVIRAX) 400 MG tablet Take 1  tablet (400 mg total) by mouth 3 (three) times daily. Patient not taking: Reported on 11/10/2018 12/24/16   Deatra Canterxford, William J, FNP  azithromycin (ZITHROMAX) 250 MG tablet Take 1 tablet (250 mg total) by mouth daily. Patient not taking: Reported on 03/27/2019 11/19/18   Geoffery Lyonselo, Douglas, MD  benzonatate (TESSALON) 100 MG capsule Take 1 capsule (100 mg total) by mouth 3 (three) times daily as needed for cough. Patient not taking: Reported on 03/27/2019 11/10/18   Tilden Fossaees, Elizabeth, MD  cephALEXin (KEFLEX) 500 MG capsule Take 1 capsule (500 mg total) by mouth 2 (two) times daily. Patient not taking: Reported on 11/10/2018 05/24/17   Everlene Farrieransie, William, PA-C  naproxen (NAPROSYN) 250 MG tablet Take 1 tablet (250 mg total) by mouth 2 (two) times daily with a meal. Patient not taking: Reported on 11/10/2018 05/24/17   Everlene Farrieransie, William, PA-C  ondansetron (ZOFRAN ODT) 4 MG disintegrating tablet Take 1 tablet (4 mg total) by mouth every 8 (eight) hours as needed for nausea or vomiting. 03/27/19   Bill SalinasMorelli, Kenley Rettinger A, PA-C  promethazine (PHENERGAN) 25 MG tablet Take 1 tablet (25 mg total) by mouth every 8 (eight) hours as needed for nausea or vomiting. Patient not taking: Reported on 03/27/2019 11/10/18 03/27/19  Tilden Fossaees, Elizabeth, MD    Family History Family History  Problem Relation Age of Onset  . Hypertension Mother     Social History Social History   Tobacco Use  . Smoking status: Current Every Day Smoker  . Smokeless tobacco: Never Used  Substance Use Topics  . Alcohol use: No  . Drug use: Yes  Types: Marijuana     Allergies   Patient has no known allergies.   Review of Systems Review of Systems  Constitutional: Negative.  Negative for chills and fever.  Respiratory: Negative.  Negative for cough and shortness of breath.   Cardiovascular: Negative.  Negative for chest pain.  Gastrointestinal: Positive for abdominal pain, diarrhea, nausea and vomiting. Negative for blood in stool.  Genitourinary:  Negative for dysuria, flank pain, hematuria, scrotal swelling and testicular pain.  All other systems reviewed and are negative.  Physical Exam Updated Vital Signs BP 114/75   Pulse (!) 110   Temp 98.7 F (37.1 C) (Oral)   Resp 18   SpO2 100%   Physical Exam Constitutional:      General: He is not in acute distress.    Appearance: Normal appearance. He is well-developed. He is not ill-appearing or diaphoretic.  HENT:     Head: Normocephalic and atraumatic.     Right Ear: External ear normal.     Left Ear: External ear normal.     Nose: Nose normal.  Eyes:     General: Vision grossly intact. Gaze aligned appropriately.     Conjunctiva/sclera: Conjunctivae normal.     Pupils: Pupils are equal, round, and reactive to light.  Neck:     Musculoskeletal: Normal range of motion and neck supple.     Trachea: Trachea and phonation normal. No tracheal tenderness or tracheal deviation.  Cardiovascular:     Rate and Rhythm: Normal rate and regular rhythm.     Pulses: Normal pulses.          Dorsalis pedis pulses are 2+ on the right side and 2+ on the left side.     Heart sounds: Normal heart sounds.  Pulmonary:     Effort: Pulmonary effort is normal. No respiratory distress.     Breath sounds: Normal breath sounds.  Abdominal:     General: There is no distension.     Palpations: Abdomen is soft.     Tenderness: There is generalized abdominal tenderness and tenderness in the right lower quadrant. There is no right CVA tenderness, left CVA tenderness, guarding or rebound. Negative signs include Murphy's sign.  Genitourinary:    Comments: Deferred by patient Musculoskeletal: Normal range of motion.  Skin:    General: Skin is warm and dry.  Neurological:     Mental Status: He is alert.     GCS: GCS eye subscore is 4. GCS verbal subscore is 5. GCS motor subscore is 6.     Comments: Speech is clear and goal oriented, follows commands Major Cranial nerves without deficit, no facial  droop Moves extremities without ataxia, coordination intact  Psychiatric:        Behavior: Behavior normal.      ED Treatments / Results  Labs (all labs ordered are listed, but only abnormal results are displayed) Labs Reviewed  COMPREHENSIVE METABOLIC PANEL - Abnormal; Notable for the following components:      Result Value   Glucose, Bld 113 (*)    Total Protein 9.1 (*)    All other components within normal limits  LIPASE, BLOOD  CBC  URINALYSIS, ROUTINE W REFLEX MICROSCOPIC    EKG None  Radiology Ct Abdomen Pelvis W Contrast  Result Date: 03/27/2019 CLINICAL DATA:  Nausea vomiting diarrhea and generalized abdominal pain for 2 days. EXAM: CT ABDOMEN AND PELVIS WITH CONTRAST TECHNIQUE: Multidetector CT imaging of the abdomen and pelvis was performed using the standard protocol following bolus  administration of intravenous contrast. CONTRAST:  145mL OMNIPAQUE IOHEXOL 300 MG/ML  SOLN COMPARISON:  None. FINDINGS: Lower chest: No acute abnormality. Hepatobiliary: No focal liver abnormality is seen. No gallstones, gallbladder wall thickening, or biliary dilatation. Pancreas: Unremarkable. No pancreatic ductal dilatation or surrounding inflammatory changes. Spleen: Normal in size without focal abnormality. Adrenals/Urinary Tract: Adrenal glands are unremarkable. Kidneys are normal, without renal calculi, focal lesion, or hydronephrosis. Bladder is unremarkable. Stomach/Bowel: Stomach is within normal limits. Appendix appears normal. No evidence of bowel wall thickening, distention, or inflammatory changes. Vascular/Lymphatic: No significant vascular findings are present. No enlarged abdominal or pelvic lymph nodes. Reproductive: Prostate is unremarkable. Other: No abdominal wall hernia or abnormality. No abdominopelvic ascites. Musculoskeletal: No acute or significant osseous findings. IMPRESSION: No evidence of acute abnormality within the abdomen or pelvis. Electronically Signed   By:  Fidela Salisbury M.D.   On: 03/27/2019 16:55    Procedures Procedures (including critical care time)  Medications Ordered in ED Medications  sodium chloride flush (NS) 0.9 % injection 3 mL (has no administration in time range)  sodium chloride (PF) 0.9 % injection (has no administration in time range)  ondansetron (ZOFRAN-ODT) disintegrating tablet 4 mg (has no administration in time range)  sodium chloride 0.9 % bolus 1,000 mL (1,000 mLs Intravenous New Bag/Given 03/27/19 1545)  iohexol (OMNIPAQUE) 300 MG/ML solution 100 mL (100 mLs Intravenous Contrast Given 03/27/19 1628)     Initial Impression / Assessment and Plan / ED Course  I have reviewed the triage vital signs and the nursing notes.  Pertinent labs & imaging results that were available during my care of the patient were reviewed by me and considered in my medical decision making (see chart for details).    CBC within normal limits Lipase within normal limits CMP with elevated protein and glucose otherwise unremarkable CT abd/pelvis:  IMPRESSION:  No evidence of acute abnormality within the abdomen or pelvis.  - Fluid bolus given Urinalysis pending - Patient reevaluated resting comfortably and in no acute distress he has received 1 L fluid bolus here in the emergency department states improvement of symptoms following this.  Heart rate 85 bpm, SPO2 100% on room air, respiratory rate approximately 16 breaths/min.  Suspect patient with viral gastroenteritis today, plan to discharge patient with ODT Zofran, PCP follow-up and encourage water rehydration.  He is without right upper quadrant tenderness is a negative Murphy sign do not suspect cholecystitis as cause of his symptoms today. - Care handoff given to Britni Henderly PA-C at shift change, plan of care at this time is to await urinalysis, pending no signs of infection and successful p.o. challenge discharge with PCP follow-up.   Note: Portions of this report may have  been transcribed using voice recognition software. Every effort was made to ensure accuracy; however, inadvertent computerized transcription errors may still be present. Final Clinical Impressions(s) / ED Diagnoses   Final diagnoses:  Viral gastroenteritis    ED Discharge Orders         Ordered    ondansetron (ZOFRAN ODT) 4 MG disintegrating tablet  Every 8 hours PRN     03/27/19 1748           Deliah Boston, PA-C 03/27/19 1751    Milton Ferguson, MD 03/27/19 2059

## 2019-03-27 NOTE — ED Provider Notes (Signed)
Care transferred from [place], [place] at shift change.  See note for full HPI.  [place] 23 year old male presents for 2 days of generalized abdominal pain.  Has had multiple episodes of NBNB emesis as well as diarrhea without melena or hematochezia.  Labs and imaging without acute abnormality.  Pending p.o. challenge and urine at care transfer.  1930: Nursing has notified me that urine was unable to be used from lab.  Will need additional collection.  Patient was able to eat crackers and drink a soda without difficulty.  No episodes of emesis.  No episodes of diarrhea in ED.  Urine without evidence of infection.  Patient is nontoxic, nonseptic appearing, in no apparent distress.  Patient's pain and other symptoms adequately managed in emergency department.  Fluid bolus given.  Labs, imaging and vitals reviewed.  Patient does not meet the SIRS or Sepsis criteria.  On repeat exam patient does not have a surgical abdomin and there are no peritoneal signs.  No indication of appendicitis, bowel obstruction, bowel perforation, cholecystitis, diverticulitis.  Patient discharged home with symptomatic treatment and given strict instructions for follow-up with their primary care physician.  I have also discussed reasons to return immediately to the ER.  Patient expresses understanding and agrees with plan.  Ct Abdomen Pelvis W Contrast  Result Date: 03/27/2019 CLINICAL DATA:  Nausea vomiting diarrhea and generalized abdominal pain for 2 days. EXAM: CT ABDOMEN AND PELVIS WITH CONTRAST TECHNIQUE: Multidetector CT imaging of the abdomen and pelvis was performed using the standard protocol following bolus administration of intravenous contrast. CONTRAST:  138mL OMNIPAQUE IOHEXOL 300 MG/ML  SOLN COMPARISON:  None. FINDINGS: Lower chest: No acute abnormality. Hepatobiliary: No focal liver abnormality is seen. No gallstones, gallbladder wall thickening, or biliary dilatation. Pancreas: Unremarkable. No pancreatic  ductal dilatation or surrounding inflammatory changes. Spleen: Normal in size without focal abnormality. Adrenals/Urinary Tract: Adrenal glands are unremarkable. Kidneys are normal, without renal calculi, focal lesion, or hydronephrosis. Bladder is unremarkable. Stomach/Bowel: Stomach is within normal limits. Appendix appears normal. No evidence of bowel wall thickening, distention, or inflammatory changes. Vascular/Lymphatic: No significant vascular findings are present. No enlarged abdominal or pelvic lymph nodes. Reproductive: Prostate is unremarkable. Other: No abdominal wall hernia or abnormality. No abdominopelvic ascites. Musculoskeletal: No acute or significant osseous findings. IMPRESSION: No evidence of acute abnormality within the abdomen or pelvis. Electronically Signed   By: Fidela Salisbury M.D.   On: 03/27/2019 16:55    Labs Reviewed  COMPREHENSIVE METABOLIC PANEL - Abnormal; Notable for the following components:      Result Value   Glucose, Bld 113 (*)    Total Protein 9.1 (*)    All other components within normal limits  URINALYSIS, ROUTINE W REFLEX MICROSCOPIC - Abnormal; Notable for the following components:   Specific Gravity, Urine >1.046 (*)    Protein, ur 30 (*)    All other components within normal limits  LIPASE, BLOOD  CBC     Dion Sibal A, PA-C 03/27/19 2140    Dorie Rank, MD 03/28/19 [phone]

## 2019-05-10 ENCOUNTER — Encounter (HOSPITAL_COMMUNITY): Payer: Self-pay | Admitting: Emergency Medicine

## 2019-05-10 ENCOUNTER — Emergency Department (HOSPITAL_COMMUNITY)
Admission: EM | Admit: 2019-05-10 | Discharge: 2019-05-10 | Disposition: A | Discharge status: Home / Self Care | Payer: Medicaid Other | Attending: Emergency Medicine | Admitting: Emergency Medicine

## 2019-05-10 ENCOUNTER — Encounter (HOSPITAL_COMMUNITY): Payer: Self-pay

## 2019-05-10 ENCOUNTER — Emergency Department (HOSPITAL_COMMUNITY)
Admission: EM | Admit: 2019-05-10 | Discharge: 2019-05-11 | Disposition: A | Discharge status: Home / Self Care | Payer: Medicaid Other | Attending: Emergency Medicine | Admitting: Emergency Medicine

## 2019-05-10 ENCOUNTER — Other Ambulatory Visit: Payer: Self-pay

## 2019-05-10 DIAGNOSIS — R109 Unspecified abdominal pain: Secondary | ICD-10-CM | POA: Insufficient documentation

## 2019-05-10 DIAGNOSIS — R112 Nausea with vomiting, unspecified: Secondary | ICD-10-CM | POA: Insufficient documentation

## 2019-05-10 DIAGNOSIS — R197 Diarrhea, unspecified: Secondary | ICD-10-CM | POA: Insufficient documentation

## 2019-05-10 DIAGNOSIS — R1013 Epigastric pain: Secondary | ICD-10-CM

## 2019-05-10 DIAGNOSIS — F17200 Nicotine dependence, unspecified, uncomplicated: Secondary | ICD-10-CM | POA: Insufficient documentation

## 2019-05-10 DIAGNOSIS — Z5321 Procedure and treatment not carried out due to patient leaving prior to being seen by health care provider: Secondary | ICD-10-CM | POA: Insufficient documentation

## 2019-05-10 LAB — COMPREHENSIVE METABOLIC PANEL
ALT: 14 U/L (ref 0–44)
AST: 25 U/L (ref 15–41)
Albumin: 4.7 g/dL (ref 3.5–5.0)
Alkaline Phosphatase: 46 U/L (ref 38–126)
Anion gap: 14 (ref 5–15)
BUN: 12 mg/dL (ref 6–20)
CO2: 26 mmol/L (ref 22–32)
Calcium: 10.2 mg/dL (ref 8.9–10.3)
Chloride: 99 mmol/L (ref 98–111)
Creatinine, Ser: 1.28 mg/dL — ABNORMAL HIGH (ref 0.61–1.24)
GFR calc Af Amer: >60 mL/min (ref >60)
GFR calc non Af Amer: >60 mL/min (ref >60)
Glucose, Bld: 125 mg/dL — ABNORMAL HIGH (ref 70–99)
Potassium: 3 mmol/L — ABNORMAL LOW (ref 3.5–5.1)
Sodium: 139 mmol/L (ref 135–145)
Total Bilirubin: 1.3 mg/dL — ABNORMAL HIGH (ref 0.3–1.2)
Total Protein: 8.2 g/dL — ABNORMAL HIGH (ref 6.5–8.1)

## 2019-05-10 LAB — CBC
HCT: 44.3 % (ref 39.0–52.0)
Hemoglobin: 15.2 g/dL (ref 13.0–17.0)
MCH: 27.7 pg (ref 26.0–34.0)
MCHC: 34.3 g/dL (ref 30.0–36.0)
MCV: 80.7 fL (ref 80.0–100.0)
Platelets: 285 10*3/uL (ref 150–400)
RBC: 5.49 MIL/uL (ref 4.22–5.81)
RDW: 13.1 % (ref 11.5–15.5)
WBC: 10 10*3/uL (ref 4.0–10.5)
nRBC: 0 % (ref 0.0–0.2)

## 2019-05-10 LAB — LIPASE, BLOOD: Lipase: 77 U/L — ABNORMAL HIGH (ref 11–51)

## 2019-05-10 NOTE — ED Triage Notes (Addendum)
Patient BIB [facility] with c/o severe abd pain, nausea, vomiting x2 days. Patient went to [facility] long ED earlier today, but left AMA in the ED.

## 2019-05-10 NOTE — ED Triage Notes (Signed)
Pt c/o abd pains with n/v for 3 days. Denies bowel or urinary problems.

## 2019-05-10 NOTE — ED Notes (Signed)
No answer for lab draw at this time.

## 2019-05-11 ENCOUNTER — Telehealth (INDEPENDENT_AMBULATORY_CARE_PROVIDER_SITE_OTHER): Payer: Self-pay | Admitting: Family Medicine

## 2019-05-11 DIAGNOSIS — R112 Nausea with vomiting, unspecified: Secondary | ICD-10-CM

## 2019-05-11 LAB — URINALYSIS, ROUTINE W REFLEX MICROSCOPIC
Glucose, UA: NEGATIVE mg/dL
Hgb urine dipstick: NEGATIVE
Ketones, ur: 15 mg/dL — AB
Nitrite: NEGATIVE
Protein, ur: 30 mg/dL — AB
Specific Gravity, Urine: 1.01 (ref 1.005–1.030)
pH: 7.5 (ref 5.0–8.0)

## 2019-05-11 LAB — POC OCCULT BLOOD, ED: Fecal Occult Bld: NEGATIVE

## 2019-05-11 LAB — URINALYSIS, MICROSCOPIC (REFLEX)
RBC / HPF: NONE SEEN RBC/hpf (ref 0–5)
Squamous Epithelial / LPF: NONE SEEN (ref 0–5)
WBC, UA: NONE SEEN WBC/hpf (ref 0–5)

## 2019-05-11 MED ORDER — DICYCLOMINE HCL 20 MG PO TABS: 20.0000 mg | ORAL_TABLET | Freq: Two times a day (BID) | ORAL | 0 refills | Status: DC

## 2019-05-11 MED ORDER — DICYCLOMINE HCL 10 MG/ML IM SOLN
20.0000 mg | Freq: Once | INTRAMUSCULAR | Status: AC
  Administered 2019-05-11: 20 mg via INTRAMUSCULAR

## 2019-05-11 MED ORDER — PROMETHAZINE HCL 25 MG RE SUPP: 25.0000 mg | Freq: Four times a day (QID) | RECTAL | 0 refills | Status: DC | PRN

## 2019-05-11 MED ORDER — SODIUM CHLORIDE 0.9 % IV BOLUS: 1000.0000 mL | Freq: Once | INTRAVENOUS | Status: DC

## 2019-05-11 MED ORDER — METOCLOPRAMIDE HCL 5 MG/ML IJ SOLN
10.0000 mg | Freq: Once | INTRAMUSCULAR | Status: AC
  Administered 2019-05-11: 10 mg via INTRAVENOUS
  Filled 2019-05-11: qty 2

## 2019-05-11 MED ORDER — ONDANSETRON 4 MG PO TBDP: 4.0000 mg | ORAL_TABLET | Freq: Three times a day (TID) | ORAL | 0 refills | Status: DC | PRN

## 2019-05-11 MED ORDER — SODIUM CHLORIDE 0.9 % IV BOLUS
1000.0000 mL | Freq: Once | INTRAVENOUS | Status: AC
  Administered 2019-05-11: 1000 mL via INTRAVENOUS

## 2019-05-11 NOTE — ED Notes (Signed)
Given Sprite for PO challenge, tolerating well. Will continue to monitor.

## 2019-05-11 NOTE — ED Provider Notes (Signed)
[facility] Provider Note   CSN: [id number] Arrival date & time: 05/10/19  2211    History   Chief Complaint Chief Complaint  Patient presents with   Abdominal Pain   Nausea    HPI Eda KeysJayquan Bejar is a 23 y.o. male with a h/o of Osgood-Schlatter's disease and cannabis use disorder.  Presents to the emergency department with a chief complaint of epigastric pain accompanied by nausea, vomiting, and diarrhea for the last 48 hours.  He reports that he had one episode of vomiting earlier today with bright red blood and has had several episodes of "dark" vomiting since.  He reports that the diarrhea has also appeared black.  No recent Pepto-Bismol use.  States that he does not drink alcohol.  He denies regular NSAID use.  No fever, chills, constipation, dysuria, hematuria, penile or testicular pain or swelling cough, chest pain, shortness of breath.  He reports that he attempted to treat his symptoms with Zofran earlier today, but began vomiting shortly after taking the medication.  He reports that his established with primary care and has a telemedicine visit in the morning.  No history of abdominal surgery.  Patient also endorses a frequent cannabis use.     The history is provided by the patient. No language interpreter was used.    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 No pertinent surgical history.      Home Medications    Prior to Admission medications   Medication Sig Start Date End Date Taking? Authorizing Provider  acyclovir (ZOVIRAX) 400 MG tablet Take 1 tablet (400 mg total) by mouth 3 (three) times daily. Patient not taking: Reported on 11/10/2018 12/24/16   Deatra Canterxford, William J, FNP  Ascorbic Acid (VITAMIN C PO) Take 2 tablets by mouth daily.    [provider]  azithromycin (ZITHROMAX) 250 MG tablet Take 1 tablet (250 mg total) by mouth daily. Patient not taking: Reported on 03/27/2019 11/19/18   Geoffery Lyonselo, Douglas, MD  benzonatate (TESSALON) 100 MG capsule Take 1 capsule (100 mg total) by mouth 3 (three) times daily as needed for cough. Patient not taking: Reported on 03/27/2019 11/10/18   Tilden Fossaees, Elizabeth, MD  cephALEXin (KEFLEX) 500 MG capsule Take 1 capsule (500 mg total) by mouth 2 (two) times daily. Patient not taking: Reported on 11/10/2018 05/24/17   Everlene Farrieransie, William, PA-C  dicyclomine (BENTYL) 20 MG tablet Take 1 tablet (20 mg total) by mouth 2 (two) times daily. 05/11/19   Caitriona Sundquist A, PA-C  naproxen (NAPROSYN) 250 MG tablet Take 1 tablet (250 mg total) by mouth 2 (two) times daily with a meal. Patient not taking: Reported on 11/10/2018 05/24/17   Everlene Farrieransie, William, PA-C  ondansetron (ZOFRAN ODT) 4 MG disintegrating tablet Take  1 tablet (4 mg total) by mouth every 8 (eight) hours as needed. 05/11/19   Miniya Miguez A, PA-C  promethazine (PHENERGAN) 25 MG suppository Place 1 suppository (25 mg total) rectally every 6 (six) hours as needed for nausea or vomiting. 05/11/19   Wilburn Keir A, PA-C    Family History Family History  Problem Relation Age of Onset   Hypertension Mother     Social History Social History   Tobacco Use   Smoking status: Current Every Day  Smoker   Smokeless tobacco: Never Used  Substance Use Topics   Alcohol use: No   Drug use: Yes    Types: Marijuana     Allergies   Patient has no known allergies.   Review of Systems Review of Systems  Constitutional: Negative for appetite change, chills and fever.  Respiratory: Negative for shortness of breath.   Cardiovascular: Negative for chest pain.  Gastrointestinal: Positive for abdominal pain, diarrhea and vomiting. Negative for anal bleeding, blood in stool, constipation and rectal pain.  Genitourinary: Negative for dysuria, flank pain, frequency, hematuria, penile pain, penile swelling and urgency.  Musculoskeletal: Negative for back pain, myalgias, neck pain and neck stiffness.  Skin: Negative for rash.  Allergic/Immunologic: Negative for immunocompromised state.  Neurological: Negative for dizziness, seizures, weakness, numbness and headaches.  Psychiatric/Behavioral: Negative for confusion.     Physical Exam Updated Vital Signs BP (!) 108/96 (BP Location: Left Arm)    Pulse (!) 55    Temp 97.8 F (36.6 C) (Temporal)    Resp 18    SpO2 99%   Physical Exam Vitals signs and nursing note reviewed.  Constitutional:      General: He is not in acute distress.    Appearance: He is well-developed. He is not ill-appearing, toxic-appearing or diaphoretic.  HENT:     Head: Normocephalic.     Mouth/Throat:     Mouth: Mucous membranes are moist.  Eyes:     Conjunctiva/sclera: Conjunctivae normal.  Neck:     Musculoskeletal: Normal range of motion and neck supple.  Cardiovascular:     Rate and Rhythm: Normal rate and regular rhythm.     Pulses: Normal pulses.     Heart sounds: Normal heart sounds. No murmur. No friction rub. No gallop.   Pulmonary:     Effort: Pulmonary effort is normal. No respiratory distress.     Breath sounds: No stridor. No wheezing, rhonchi or rales.  Chest:     Chest wall: No tenderness.  Abdominal:     General: There is no distension.       Palpations: Abdomen is soft. There is no mass.     Tenderness: There is abdominal tenderness. There is no right CVA tenderness, left CVA tenderness, guarding or rebound.     Hernia: No hernia is present.     Comments: Tender palpation in the epigastric region.  No rebound or guarding.  Abdomen is soft, nondistended.  He has a negative Murphy sign with no focal right upper quadrant tenderness.  No tenderness over McBurney's point.  No CVA tenderness bilaterally.  Genitourinary:    Comments: Chaperoned exam.  Soft brown stool noted on rectal exam.  No external hemorrhoids.  No obvious fissures or tears. Skin:    General: Skin is warm and dry.     Capillary Refill: Capillary refill takes less than 2 seconds.  Neurological:     Mental Status: He is alert.  Psychiatric:        Behavior: Behavior  normal.      ED Treatments / Results  Labs (all labs ordered are listed, but only abnormal results are displayed) Labs Reviewed  LIPASE, BLOOD - Abnormal; Notable for the following components:      Result Value   Lipase 77 (*)    All other components within normal limits  COMPREHENSIVE METABOLIC PANEL - Abnormal; Notable for the following components:   Potassium 3.0 (*)    Glucose, Bld 125 (*)    Creatinine, Ser 1.28 (*)    Total Protein 8.2 (*)    Total Bilirubin 1.3 (*)    All other components within normal limits  URINALYSIS, ROUTINE W REFLEX MICROSCOPIC - Abnormal; Notable for the following components:   Color, Urine AMBER (*)    Bilirubin Urine SMALL (*)    Ketones, ur 15 (*)    Protein, ur 30 (*)    Leukocytes,Ua MODERATE (*)    All other components within normal limits  URINALYSIS, MICROSCOPIC (REFLEX) - Abnormal; Notable for the following components:   Bacteria, UA RARE (*)    All other components within normal limits  URINE CULTURE  CBC  POC OCCULT BLOOD, ED    EKG None  Radiology No results found.  Procedures Procedures (including critical care  time)  Medications Ordered in ED Medications  sodium chloride 0.9 % bolus 1,000 mL (0 mLs Intravenous Stopped 05/11/19 0527)  metoCLOPramide (REGLAN) injection 10 mg (10 mg Intravenous Given 05/11/19 0427)  dicyclomine (BENTYL) injection 20 mg (20 mg Intramuscular Given 05/11/19 0427)     Initial Impression / Assessment and Plan / ED Course  I have reviewed the triage vital signs and the nursing notes.  Pertinent labs & imaging results that were available during my care of the patient were reviewed by me and considered in my medical decision making (see chart for details).        23 year old male with a h/o of Osgood-Schlatter's disease and cannabis use disorder presenting with epigastric abdominal pain, nausea, vomiting, and diarrhea for the last 48 hours.  He reports one episode of bright red hematemesis and also endorses that both his emesis and diarrhea have become increasingly dark.  No recent Pepto-Bismol use.  No constitutional symptoms.  Patient does not appear to have any risk factors for peptic ulcer disease as he does not use NSAIDs and denies alcohol use.  Hemoccult is negative.  Low suspicion for peptic ulcer disease.  Lipase is elevated at 77, but doubt early pancreatitis.  AST and ALT are normal.  Right upper quadrant exam is unremarkable.  Doubt cholecystitis or choledocholithiasis.  He does have a a minimal increase in his creatinine 1.28, up from 1.06 and June with mild ketonuria on UA.  He does have moderate leukocyte esterase and mild proteinuria on UA with small bilirubinuria.  However, he denies genitourinary complaints and has no CVA tenderness.  We will have the patient follow-up with primary care for repeat urinalysis.  Urine culture sent.  He has no peritoneal signs on abdominal exam, and the patient's pain resolved after 1 dose of Bentyl and a liter of IV fluids.  He was tolerating fluids by mouth after 1 dose of Reglan.  He reports that he is feeling much better and is  ready for discharge.  At this time, I feel that no further urgent or emergent work-up is indicated.  Low suspicion for testicular torsion, diverticulitis, pyelonephritis, or urethritis.  He is hemodynamically stable in no acute distress.  We will discharge him  home with supportive care and outpatient follow-up.  Final Clinical Impressions(s) / ED Diagnoses   Final diagnoses:  Nausea vomiting and diarrhea  Epigastric pain    ED Discharge Orders         Ordered    dicyclomine (BENTYL) 20 MG tablet  2 times daily     05/11/19 0528    ondansetron (ZOFRAN ODT) 4 MG disintegrating tablet  Every 8 hours PRN     05/11/19 0528    promethazine (PHENERGAN) 25 MG suppository  Every 6 hours PRN     05/11/19 0528           Frederik PearMcDonald, Tiberius Loftus A, PA-C 05/11/19 0833    Ward, Layla MawKristen N, DO 05/11/19 2320

## 2019-05-11 NOTE — Progress Notes (Signed)
[place] Telemedicine Visit  Patient consented to have virtual visit. Method of visit: Video was attempted, but technology challenges prevented patient from using video, so visit was conducted via telephone.  Encounter participants: Patient: Jason Benjamin - located at home Provider: Bonnita Hollow - located at office Others (if applicable): Mother  Chief Complaint: N/V  HPI: Jason Benjamin is a 23 y.o. male who presents with 2 days of nausea vomiting.  Patient went to the emergency department last night.  He was given fluids, antiemetics and discharged home.  Patient states that he is feeling better this morning.  He is tolerating p.o. fluids.  Abdominal pain is improved.  Mother on the phone as well says that he is doing better although not fully resolved of his symptoms.  Medications he was prescribed from the ED are helping with symptoms though.   ROS: per HPI  Pertinent PMHx: Cannabis use disorder  Exam:  Respiratory: Able to speak in full sentences without issue  Assessment/Plan: Nausea and vomiting Patient with 48 hours of improved nausea and vomiting.  Possible viral gastroenteritis.  Status post ED visit.  Improved on fluids and antiemetics.  Tolerating p.o.  Return precautions given.  Time spent during visit with patient: 5 minutes

## 2019-05-11 NOTE — ED Notes (Signed)
Pt declined getting vitals updated

## 2019-05-11 NOTE — Discharge Instructions (Signed)
Thank you for allowing me to care for you today in the Emergency Department.   Your follow-up appointment with primary care in the morning to make sure that your symptoms have resolved.  That 1 tablet of Zofran dissolve in your tongue every 8 hours as needed for nausea and vomiting.  If you are unable to keep fluids down Zofran, you can place one Phenergan suppository by rectum every 6 hours as needed.  Take 1 tablet of Bentyl up to 2 times daily to help with abdominal pain and cramping.  Make sure that you are drinking plenty of fluids currently 64 ounces of water per day.  Return to the emergency department if you develop persistent vomiting despite taking Zofran or Phenergan, if you stop producing urine, if you develop severe abdominal pain with a high fever, or other new, concerning symptoms.

## 2019-05-11 NOTE — ED Notes (Signed)
E-signature not available, verbalized understanding of DC instructions and Rx 

## 2019-05-12 LAB — URINE CULTURE: Culture: NO GROWTH

## 2020-05-23 ENCOUNTER — Ambulatory Visit: Payer: Medicaid Other

## 2020-05-23 NOTE — Progress Notes (Deleted)
    SUBJECTIVE:   CHIEF COMPLAINT / HPI:   *** Toe Pain ***  PERTINENT  PMH / PSH: ***  OBJECTIVE:   There were no vitals taken for this visit.   Physical Exam: *** General: 25 y.o. male in NAD Cardio: RRR no m/r/g Lungs: CTAB, no wheezing, no rhonchi, no crackles, no IWOB on *** Abdomen: Soft, non-tender to palpation, non-distended, positive bowel sounds Skin: warm and dry Extremities: No edema  Foot:*** Inspection:  No obvious bony deformity.  No swelling, erythema, or bruising.  Normal arch Palpation: No tenderness to palpation ROM: Full  ROM of the ankle. Normal midfoot flexibility Strength: 5/5 strength ankle in all planes Neurovascular: N/V intact distally in the lower extremity Special tests: Negative anterior drawer. Negative squeeze. normal midfoot flexibility. Normal calcaneal motion with heel raise   ASSESSMENT/PLAN:   No problem-specific Assessment & Plan notes found for this encounter.     Unknown Jim, DO [facility]

## 2021-03-15 ENCOUNTER — Emergency Department (HOSPITAL_COMMUNITY)
Admission: EM | Admit: 2021-03-15 | Discharge: 2021-03-15 | Disposition: A | Discharge status: Home / Self Care | Attending: Emergency Medicine | Admitting: Emergency Medicine

## 2021-03-15 ENCOUNTER — Other Ambulatory Visit: Payer: Self-pay

## 2021-03-15 ENCOUNTER — Encounter (HOSPITAL_COMMUNITY): Payer: Self-pay | Admitting: Emergency Medicine

## 2021-03-15 ENCOUNTER — Emergency Department (HOSPITAL_COMMUNITY)

## 2021-03-15 DIAGNOSIS — F17200 Nicotine dependence, unspecified, uncomplicated: Secondary | ICD-10-CM | POA: Diagnosis not present

## 2021-03-15 DIAGNOSIS — E860 Dehydration: Secondary | ICD-10-CM | POA: Insufficient documentation

## 2021-03-15 DIAGNOSIS — N10 Acute tubulo-interstitial nephritis: Secondary | ICD-10-CM | POA: Diagnosis not present

## 2021-03-15 DIAGNOSIS — R109 Unspecified abdominal pain: Secondary | ICD-10-CM | POA: Diagnosis present

## 2021-03-15 LAB — COMPREHENSIVE METABOLIC PANEL
ALT: 13 U/L (ref 0–44)
AST: 18 U/L (ref 15–41)
Albumin: 5.8 g/dL — ABNORMAL HIGH (ref 3.5–5.0)
Alkaline Phosphatase: 46 U/L (ref 38–126)
Anion gap: 15 (ref 5–15)
BUN: 23 mg/dL — ABNORMAL HIGH (ref 6–20)
CO2: 30 mmol/L (ref 22–32)
Calcium: 10.5 mg/dL — ABNORMAL HIGH (ref 8.9–10.3)
Chloride: 98 mmol/L (ref 98–111)
Creatinine, Ser: 1.18 mg/dL (ref 0.61–1.24)
GFR, Estimated: >60 mL/min (ref >60)
Glucose, Bld: 110 mg/dL — ABNORMAL HIGH (ref 70–99)
Potassium: 3.1 mmol/L — ABNORMAL LOW (ref 3.5–5.1)
Sodium: 143 mmol/L (ref 135–145)
Total Bilirubin: 1.6 mg/dL — ABNORMAL HIGH (ref 0.3–1.2)
Total Protein: 9.2 g/dL — ABNORMAL HIGH (ref 6.5–8.1)

## 2021-03-15 LAB — URINALYSIS, ROUTINE W REFLEX MICROSCOPIC
Bilirubin Urine: NEGATIVE
Glucose, UA: NEGATIVE mg/dL
Hgb urine dipstick: NEGATIVE
Ketones, ur: 80 mg/dL — AB
Nitrite: NEGATIVE
Protein, ur: 30 mg/dL — AB
Specific Gravity, Urine: 1.01 (ref 1.005–1.030)
pH: 5 (ref 5.0–8.0)

## 2021-03-15 LAB — RAPID URINE DRUG SCREEN, HOSP PERFORMED
Amphetamines: NOT DETECTED
Barbiturates: NOT DETECTED
Benzodiazepines: NOT DETECTED
Cocaine: NOT DETECTED
Opiates: POSITIVE — AB
Tetrahydrocannabinol: POSITIVE — AB

## 2021-03-15 LAB — CBC WITH DIFFERENTIAL/PLATELET
Abs Immature Granulocytes: 0.02 10*3/uL (ref 0.00–0.07)
Basophils Absolute: 0 10*3/uL (ref 0.0–0.1)
Basophils Relative: 0 %
Eosinophils Absolute: 0 10*3/uL (ref 0.0–0.5)
Eosinophils Relative: 0 %
HCT: 46.3 % (ref 39.0–52.0)
Hemoglobin: 15.6 g/dL (ref 13.0–17.0)
Immature Granulocytes: 0 %
Lymphocytes Relative: 19 %
Lymphs Abs: 1.8 10*3/uL (ref 0.7–4.0)
MCH: 27.7 pg (ref 26.0–34.0)
MCHC: 33.7 g/dL (ref 30.0–36.0)
MCV: 82.1 fL (ref 80.0–100.0)
Monocytes Absolute: 0.6 10*3/uL (ref 0.1–1.0)
Monocytes Relative: 7 %
Neutro Abs: 7.2 10*3/uL (ref 1.7–7.7)
Neutrophils Relative %: 74 %
Platelets: 273 10*3/uL (ref 150–400)
RBC: 5.64 MIL/uL (ref 4.22–5.81)
RDW: 13.9 % (ref 11.5–15.5)
WBC: 9.7 10*3/uL (ref 4.0–10.5)
nRBC: 0 % (ref 0.0–0.2)

## 2021-03-15 LAB — LIPASE, BLOOD: Lipase: 46 U/L (ref 11–51)

## 2021-03-15 MED ORDER — LORAZEPAM 2 MG/ML IJ SOLN
1.0000 mg | Freq: Once | INTRAMUSCULAR | Status: AC
  Administered 2021-03-15: 1 mg via INTRAVENOUS
  Filled 2021-03-15: qty 1

## 2021-03-15 MED ORDER — SODIUM CHLORIDE (PF) 0.9 % IJ SOLN
INTRAMUSCULAR | Status: AC
  Filled 2021-03-15: qty 50

## 2021-03-15 MED ORDER — IOHEXOL 300 MG/ML  SOLN
100.0000 mL | Freq: Once | INTRAMUSCULAR | Status: AC | PRN
  Administered 2021-03-15: 100 mL via INTRAVENOUS

## 2021-03-15 MED ORDER — MORPHINE SULFATE (PF) 4 MG/ML IV SOLN
4.0000 mg | Freq: Once | INTRAVENOUS | Status: AC
  Administered 2021-03-15: 4 mg via INTRAVENOUS
  Filled 2021-03-15: qty 1

## 2021-03-15 MED ORDER — CEPHALEXIN 500 MG PO CAPS: 500.0000 mg | ORAL_CAPSULE | Freq: Two times a day (BID) | ORAL | 0 refills | Status: AC

## 2021-03-15 MED ORDER — PROMETHAZINE HCL 25 MG PO TABS: 25.0000 mg | ORAL_TABLET | Freq: Four times a day (QID) | ORAL | 0 refills | Status: DC | PRN

## 2021-03-15 MED ORDER — SODIUM CHLORIDE 0.9 % IV BOLUS
1000.0000 mL | Freq: Once | INTRAVENOUS | Status: AC
  Administered 2021-03-15: 1000 mL via INTRAVENOUS

## 2021-03-15 MED ORDER — SODIUM CHLORIDE 0.9 % IV SOLN
1.0000 g | Freq: Once | INTRAVENOUS | Status: AC
  Administered 2021-03-15: 1 g via INTRAVENOUS
  Filled 2021-03-15: qty 10

## 2021-03-15 MED ORDER — KETOROLAC TROMETHAMINE 30 MG/ML IJ SOLN
15.0000 mg | Freq: Once | INTRAMUSCULAR | Status: AC
Start: 2021-03-15 — End: 2021-03-15
  Administered 2021-03-15: 15 mg via INTRAVENOUS
  Filled 2021-03-15: qty 1

## 2021-03-15 MED ORDER — ONDANSETRON HCL 4 MG/2ML IJ SOLN
4.0000 mg | Freq: Once | INTRAMUSCULAR | Status: AC
  Administered 2021-03-15: 4 mg via INTRAVENOUS
  Filled 2021-03-15: qty 2

## 2021-03-15 MED ORDER — SODIUM CHLORIDE 0.9 % IV SOLN: INTRAVENOUS | Status: DC
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
## 2021-03-15 NOTE — ED Notes (Signed)
Pt resting, respirations even and unlabored. N/V has decreased.
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
## 2022-03-17 ENCOUNTER — Encounter: Payer: Self-pay | Admitting: *Deleted

## 2022-09-23 ENCOUNTER — Ambulatory Visit (HOSPITAL_COMMUNITY)
Admission: RE | Admit: 2022-09-23 | Discharge: 2022-09-23 | Disposition: A | Discharge status: Home / Self Care | Payer: Medicaid Other | Source: Ambulatory Visit | Attending: Family Medicine | Admitting: Family Medicine

## 2022-09-23 ENCOUNTER — Encounter (HOSPITAL_COMMUNITY): Payer: Self-pay

## 2022-09-23 VITALS — BP 146/84 | HR 115 | Temp 98.6°F | Resp 16

## 2022-09-23 DIAGNOSIS — H1032 Unspecified acute conjunctivitis, left eye: Secondary | ICD-10-CM | POA: Diagnosis not present

## 2022-09-23 MED ORDER — TOBRAMYCIN 0.3 % OP SOLN: 1.0000 [drp] | Freq: Four times a day (QID) | OPHTHALMIC | 0 refills | Status: DC

## 2022-09-23 NOTE — ED Triage Notes (Signed)
Pt presents to the office for left pink eye.

## 2022-09-23 NOTE — ED Provider Notes (Signed)
  [facility]   [id number] 09/23/22 Arrival Time: 1525  ASSESSMENT & PLAN:  1. Acute conjunctivitis of left eye, unspecified acute conjunctivitis type    See AVS for d/c information.  Meds ordered this encounter  Medications   tobramycin (TOBREX) 0.3 % ophthalmic solution    Sig: Place 1 drop into the right eye every 6 (six) hours.    Dispense:  5 mL    Refill:  0    Discussed the diagnosis and proper care of conjunctivitis.  Stressed household Presenter, broadcasting. Ophthalmic drops per orders. Warm compress to eye(s). Local eye care discussed.  Reviewed expectations re: course of current medical issues. Questions answered. Outlined signs and symptoms indicating need for more acute intervention. Patient verbalized understanding. After Visit Summary given.   SUBJECTIVE:  Jason Benjamin is a 26 y.o. male who presents with complaint of left eye irritation/redness; x 3-4 days; mild watery drainage. Injury: no. Visual changes: no. Contact lens use: no. Recent illness: no. Self treatment: none.   OBJECTIVE:  Vitals:   09/23/22 1548  BP: (!) 146/84  Pulse: (!) 115  Resp: 16  Temp: 98.6 F (37 C)  TempSrc: Oral  SpO2: 100%    Recheck P: 108 and regular.  General appearance: alert; no distress HEENT: [place]; AT; PERRLA; no restriction of the extraocular movements OS: without reported pain; with conjunctival injection; with watery drainage; without corneal opacities; without limbal flush; without periorbital swelling or erythema Neck: supple without LAD Lungs: clear to auscultation bilaterally; unlabored respirations Heart: regular rate and rhythm Skin: warm and dry Psychological: alert and cooperative; normal mood and affect     No Known Allergies  Past Medical History:  Diagnosis Date   ADHD (attention deficit hyperactivity disorder)    Depression    Knee pain, acute 03/27/2011   Treated at [facility] with PT 7/5-9/19/12    Osgood-Schlatter's disease  05/15/2011   Social History   Socioeconomic History   Marital status: Single    Spouse name: Not on file   Number of children: Not on file   Years of education: Not on file   Highest education level: Not on file  Occupational History   Not on file  Tobacco Use   Smoking status: Every Day   Smokeless tobacco: Never  Substance and Sexual Activity   Alcohol use: No   Drug use: Yes    Types: Marijuana   Sexual activity: Yes    Birth control/protection: None  Other Topics Concern   Not on file  Social History Narrative   [facility] [phone])   Social Determinants of Health   Financial Resource Strain: Not on file  Food Insecurity: Not on file  Transportation Needs: Not on file  Physical Activity: Not on file  Stress: Not on file  Social Connections: Not on file  Intimate Partner Violence: Not on file   Family History  Problem Relation Age of Onset   Hypertension Mother    History reviewed. No pertinent surgical history.    Mardella Layman, MD 09/23/22 [phone]

## 2022-09-24 NOTE — Progress Notes (Deleted)
    SUBJECTIVE:   Chief compliant/HPI: annual examination  Jason Benjamin is a 26 y.o. who presents today for an annual exam.   Reviewed and updated history ***.   History pyleo/UTI  Review of systems form notable for ***.    OBJECTIVE:   There were no vitals taken for this visit.  ***  ASSESSMENT/PLAN:   No problem-specific Assessment & Plan notes found for this encounter.    Annual Examination  See AVS for age appropriate recommendations  PHQ score ***, reviewed and discussed.  Blood pressure reviewed and at goal. ***   Advanced directive ***   Considered the following items based upon USPSTF recommendations: HIV testing: {not indicated/requested/declined:14582} Hepatitis C: {not indicated/requested/declined:14582} Hepatitis B: {not indicated/requested/declined:14582} Syphilis if at high risk: {{not indicated/requested/declined:14582} GC/CT{not indicated/requested/declined:14582} Lipid panel (nonfasting or fasting) discussed based upon AHA recommendations and {ordered not order:23822}.  Consider repeat every 4-6 years.  Reviewed risk factors for latent tuberculosis and {not indicated/requested/declined:14582} Immunizations ***   Follow up in 1 year or sooner if indicated.    Elberta Fortis, MD [facility]

## 2022-10-02 ENCOUNTER — Ambulatory Visit: Payer: Medicaid Other | Admitting: Family Medicine

## 2022-10-15 ENCOUNTER — Emergency Department (HOSPITAL_COMMUNITY)
Admission: EM | Admit: 2022-10-15 | Discharge: 2022-10-15 | Discharge status: Against Medical Advice | Payer: Medicaid Other | Attending: Student | Admitting: Student

## 2022-10-15 ENCOUNTER — Encounter (HOSPITAL_COMMUNITY): Payer: Self-pay

## 2022-10-15 ENCOUNTER — Encounter (HOSPITAL_BASED_OUTPATIENT_CLINIC_OR_DEPARTMENT_OTHER): Payer: Self-pay | Admitting: Emergency Medicine

## 2022-10-15 ENCOUNTER — Emergency Department (HOSPITAL_BASED_OUTPATIENT_CLINIC_OR_DEPARTMENT_OTHER)
Admission: EM | Admit: 2022-10-15 | Discharge: 2022-10-16 | Disposition: A | Discharge status: Home / Self Care | Payer: Medicaid Other | Source: Home / Self Care | Attending: Emergency Medicine | Admitting: Emergency Medicine

## 2022-10-15 ENCOUNTER — Other Ambulatory Visit: Payer: Self-pay

## 2022-10-15 ENCOUNTER — Emergency Department (HOSPITAL_BASED_OUTPATIENT_CLINIC_OR_DEPARTMENT_OTHER): Payer: Medicaid Other

## 2022-10-15 DIAGNOSIS — R1084 Generalized abdominal pain: Secondary | ICD-10-CM | POA: Insufficient documentation

## 2022-10-15 DIAGNOSIS — R112 Nausea with vomiting, unspecified: Secondary | ICD-10-CM | POA: Insufficient documentation

## 2022-10-15 DIAGNOSIS — R197 Diarrhea, unspecified: Secondary | ICD-10-CM | POA: Insufficient documentation

## 2022-10-15 DIAGNOSIS — Z5321 Procedure and treatment not carried out due to patient leaving prior to being seen by health care provider: Secondary | ICD-10-CM | POA: Diagnosis not present

## 2022-10-15 DIAGNOSIS — Z20822 Contact with and (suspected) exposure to covid-19: Secondary | ICD-10-CM | POA: Diagnosis not present

## 2022-10-15 LAB — URINALYSIS, ROUTINE W REFLEX MICROSCOPIC
Glucose, UA: NEGATIVE mg/dL
Hgb urine dipstick: NEGATIVE
Ketones, ur: 40 mg/dL — AB
Leukocytes,Ua: NEGATIVE
Nitrite: NEGATIVE
Specific Gravity, Urine: >1.046 — ABNORMAL HIGH (ref 1.005–1.030)
pH: 6 (ref 5.0–8.0)

## 2022-10-15 LAB — COMPREHENSIVE METABOLIC PANEL
ALT: 15 U/L (ref 0–44)
AST: 21 U/L (ref 15–41)
Albumin: 4.6 g/dL (ref 3.5–5.0)
Alkaline Phosphatase: 44 U/L (ref 38–126)
Anion gap: 15 (ref 5–15)
BUN: 16 mg/dL (ref 6–20)
CO2: 23 mmol/L (ref 22–32)
Calcium: 9.7 mg/dL (ref 8.9–10.3)
Chloride: 99 mmol/L (ref 98–111)
Creatinine, Ser: 1.09 mg/dL (ref 0.61–1.24)
GFR, Estimated: >60 mL/min (ref >60)
Glucose, Bld: 120 mg/dL — ABNORMAL HIGH (ref 70–99)
Potassium: 3.8 mmol/L (ref 3.5–5.1)
Sodium: 137 mmol/L (ref 135–145)
Total Bilirubin: 0.9 mg/dL (ref 0.3–1.2)
Total Protein: 8.5 g/dL — ABNORMAL HIGH (ref 6.5–8.1)

## 2022-10-15 LAB — CBC WITH DIFFERENTIAL/PLATELET
Abs Immature Granulocytes: 0.02 10*3/uL (ref 0.00–0.07)
Basophils Absolute: 0 10*3/uL (ref 0.0–0.1)
Basophils Relative: 1 %
Eosinophils Absolute: 0 10*3/uL (ref 0.0–0.5)
Eosinophils Relative: 0 %
HCT: 44.2 % (ref 39.0–52.0)
Hemoglobin: 14.9 g/dL (ref 13.0–17.0)
Immature Granulocytes: 0 %
Lymphocytes Relative: 19 %
Lymphs Abs: 1.3 10*3/uL (ref 0.7–4.0)
MCH: 26.9 pg (ref 26.0–34.0)
MCHC: 33.7 g/dL (ref 30.0–36.0)
MCV: 79.9 fL — ABNORMAL LOW (ref 80.0–100.0)
Monocytes Absolute: 0.3 10*3/uL (ref 0.1–1.0)
Monocytes Relative: 5 %
Neutro Abs: 5 10*3/uL (ref 1.7–7.7)
Neutrophils Relative %: 75 %
Platelets: 292 10*3/uL (ref 150–400)
RBC: 5.53 MIL/uL (ref 4.22–5.81)
RDW: 12.7 % (ref 11.5–15.5)
WBC: 6.6 10*3/uL (ref 4.0–10.5)
nRBC: 0 % (ref 0.0–0.2)

## 2022-10-15 LAB — RESP PANEL BY RT-PCR (RSV, FLU A&B, COVID)  RVPGX2
Influenza A by PCR: NEGATIVE
Influenza B by PCR: NEGATIVE
Resp Syncytial Virus by PCR: NEGATIVE
SARS Coronavirus 2 by RT PCR: NEGATIVE

## 2022-10-15 LAB — LIPASE, BLOOD: Lipase: 33 U/L (ref 11–51)

## 2022-10-15 LAB — CBG MONITORING, ED: Glucose-Capillary: 110 mg/dL — ABNORMAL HIGH (ref 70–99)

## 2022-10-15 MED ORDER — DIPHENHYDRAMINE HCL 50 MG/ML IJ SOLN
25.0000 mg | Freq: Once | INTRAMUSCULAR | Status: AC
  Administered 2022-10-15: 25 mg via INTRAVENOUS
  Filled 2022-10-15: qty 1

## 2022-10-15 MED ORDER — PANTOPRAZOLE SODIUM 40 MG IV SOLR
40.0000 mg | Freq: Once | INTRAVENOUS | Status: AC
  Administered 2022-10-15: 40 mg via INTRAVENOUS
  Filled 2022-10-15: qty 10

## 2022-10-15 MED ORDER — SODIUM CHLORIDE 0.9 % IV BOLUS
1000.0000 mL | Freq: Once | INTRAVENOUS | Status: AC
  Administered 2022-10-16: 1000 mL via INTRAVENOUS

## 2022-10-15 MED ORDER — ONDANSETRON 4 MG PO TBDP: 4.0000 mg | ORAL_TABLET | Freq: Once | ORAL | Status: DC

## 2022-10-15 MED ORDER — KETOROLAC TROMETHAMINE 15 MG/ML IJ SOLN
15.0000 mg | Freq: Once | INTRAMUSCULAR | Status: AC
  Administered 2022-10-15: 15 mg via INTRAVENOUS
  Filled 2022-10-15: qty 1

## 2022-10-15 MED ORDER — ONDANSETRON HCL 4 MG/2ML IJ SOLN
4.0000 mg | Freq: Once | INTRAMUSCULAR | Status: AC
  Administered 2022-10-15: 4 mg via INTRAVENOUS
  Filled 2022-10-15: qty 2

## 2022-10-15 MED ORDER — FAMOTIDINE 20 MG PO TABS: 20.0000 mg | ORAL_TABLET | Freq: Two times a day (BID) | ORAL | 0 refills | Status: DC

## 2022-10-15 MED ORDER — METOCLOPRAMIDE HCL 10 MG PO TABS: 10.0000 mg | ORAL_TABLET | Freq: Four times a day (QID) | ORAL | 0 refills | Status: DC

## 2022-10-15 MED ORDER — IOHEXOL 300 MG/ML  SOLN
100.0000 mL | Freq: Once | INTRAMUSCULAR | Status: AC | PRN
  Administered 2022-10-15: 100 mL via INTRAVENOUS

## 2022-10-15 MED ORDER — METOCLOPRAMIDE HCL 5 MG/ML IJ SOLN
10.0000 mg | Freq: Once | INTRAMUSCULAR | Status: AC
  Administered 2022-10-15: 10 mg via INTRAVENOUS
  Filled 2022-10-15: qty 2

## 2022-10-15 NOTE — ED Notes (Signed)
Pt would not remove coat for vitals

## 2022-10-15 NOTE — ED Provider Triage Note (Signed)
Emergency Medicine Provider Triage Evaluation Note  Jason Benjamin , a 27 y.o. male  was evaluated in triage.  Complains of 12 hours of nausea, vomiting and diarrhea.  No known sick contacts.  Mild generalized tenderness.  Denies alcohol or drug use.  Patient not wanting to provide history in triage  Review of Systems  Positive:  Negative:   Physical Exam  There were no vitals taken for this visit. Gen:   Awake, no distress   Resp:  Normal effort  MSK:   Moves extremities without difficulty  Other:  Actively dry heaving  Medical Decision Making  Medically screening exam initiated at 12:57 PM.  Appropriate orders placed.  Jason Benjamin was informed that the remainder of the evaluation will be completed by another provider, this initial triage assessment does not replace that evaluation, and the importance of remaining in the ED until their evaluation is complete.     Rhae Hammock, PA-C 10/15/22 1258

## 2022-10-15 NOTE — ED Triage Notes (Signed)
N/V/D since yesterday. Seen at [facility] but left. Had blood drawn.

## 2022-10-15 NOTE — ED Provider Notes (Signed)
[facility] EMERGENCY DEPT Provider Note   CSN: [id number] Arrival date & time: 10/15/22  1554     History  Chief Complaint  Patient presents with   Emesis    Jason Benjamin is a 27 y.o. male.  Patient seen by myself on arrival to the emergency department in triage.  Reviewed labs from earlier today performed at [facility]. Pt complains of nausea, vomiting, diarrhea starting yesterday.  He also has generalized abdominal tenderness.  Diarrhea has been soft and watery.  No blood in the stool.  He has noted some small streaks of blood, described as red, in the vomit.  No urinary symptoms.  He has had a runny nose without other respiratory symptoms.  No suspicious food or water exposures.  Denies drugs or alcohol.  Does have a previous history of THC use.       Home Medications Prior to Admission medications   Medication Sig Start Date End Date Taking? Authorizing Provider  acyclovir (ZOVIRAX) 400 MG tablet Take 1 tablet (400 mg total) by mouth 3 (three) times daily. Patient not taking: Reported on 11/10/2018 12/24/16   Lysbeth Penner, FNP  Ascorbic Acid (VITAMIN C PO) Take 2 tablets by mouth daily.    [provider]  azithromycin (ZITHROMAX) 250 MG tablet Take 1 tablet (250 mg total) by mouth daily. Patient not taking: Reported on 03/27/2019 11/19/18   Veryl Speak, MD  benzonatate (TESSALON) 100 MG capsule Take 1 capsule (100 mg total) by mouth 3 (three) times daily as needed for cough. Patient not taking: Reported on 03/27/2019 11/10/18   Quintella Reichert, MD  cephALEXin (KEFLEX) 500 MG capsule Take 1 capsule (500 mg total) by mouth 2 (two) times daily. Patient not taking: Reported on 11/10/2018 05/24/17   Waynetta Pean, PA-C  dicyclomine (BENTYL) 20 MG tablet Take 1 tablet (20 mg total) by mouth 2 (two) times daily. 05/11/19   McDonald, Mia A, PA-C  naproxen (NAPROSYN) 250 MG tablet Take 1 tablet (250 mg total) by mouth 2 (two) times daily with a meal. Patient not  taking: Reported on 11/10/2018 05/24/17   Waynetta Pean, PA-C  ondansetron (ZOFRAN ODT) 4 MG disintegrating tablet Take 1 tablet (4 mg total) by mouth every 8 (eight) hours as needed. 05/11/19   McDonald, Mia A, PA-C  promethazine (PHENERGAN) 25 MG suppository Place 1 suppository (25 mg total) rectally every 6 (six) hours as needed for nausea or vomiting. 05/11/19   McDonald, Mia A, PA-C  promethazine (PHENERGAN) 25 MG tablet Take 1 tablet (25 mg total) by mouth every 6 (six) hours as needed for nausea or vomiting. 03/15/21   Carmin Muskrat, MD  tobramycin (TOBREX) 0.3 % ophthalmic solution Place 1 drop into the right eye every 6 (six) hours. 09/23/22   Vanessa Kick, MD      Allergies    Patient has no known allergies.    Review of Systems   Review of Systems  Physical Exam Updated Vital Signs BP (!) 129/92   Pulse 100   Temp 99.3 F (37.4 C) (Oral)   Resp 18   SpO2 98%  Physical Exam Vitals and nursing note reviewed.  Constitutional:      General: He is not in acute distress.    Appearance: He is well-developed.  HENT:     Head: Normocephalic and atraumatic.  Eyes:     General:        Right eye: No discharge.        Left eye: No  discharge.     Conjunctiva/sclera: Conjunctivae normal.  Cardiovascular:     Rate and Rhythm: Normal rate and regular rhythm.     Heart sounds: Normal heart sounds.  Pulmonary:     Effort: Pulmonary effort is normal.     Breath sounds: Normal breath sounds.  Abdominal:     Palpations: Abdomen is soft.     Tenderness: There is abdominal tenderness. There is no guarding or rebound.     Comments: Mild to moderate generalized abdominal tenderness to palpation without rebound or guarding.  Musculoskeletal:     Cervical back: Normal range of motion and neck supple.  Skin:    General: Skin is warm and dry.  Neurological:     Mental Status: He is alert.     ED Results / Procedures / Treatments   Labs (all labs ordered are listed, but only  abnormal results are displayed) Labs Reviewed  URINALYSIS, ROUTINE W REFLEX MICROSCOPIC - Abnormal; Notable for the following components:      Result Value   Specific Gravity, Urine >1.046 (*)    Bilirubin Urine SMALL (*)    Ketones, ur 40 (*)    Protein, ur TRACE (*)    Bacteria, UA RARE (*)    All other components within normal limits    EKG None  Radiology CT ABDOMEN PELVIS W CONTRAST  Result Date: 10/15/2022 CLINICAL DATA:  Upper abdominal pain and vomiting. EXAM: CT ABDOMEN AND PELVIS WITH CONTRAST TECHNIQUE: Multidetector CT imaging of the abdomen and pelvis was performed using the standard protocol following bolus administration of intravenous contrast. RADIATION DOSE REDUCTION: This exam was performed according to the departmental dose-optimization program which includes automated exposure control, adjustment of the mA and/or kV according to patient size and/or use of iterative reconstruction technique. CONTRAST:  145mL OMNIPAQUE IOHEXOL 300 MG/ML  SOLN COMPARISON:  March 15, 2021 FINDINGS: Lower chest: No acute abnormality. Hepatobiliary: A stable 1.7 cm x 1.8 cm well-defined focus of suspected focal hepatic steatosis is seen within the anterior aspect of the right lobe of the liver. No gallstones, gallbladder wall thickening, or biliary dilatation. Pancreas: Unremarkable. No pancreatic ductal dilatation or surrounding inflammatory changes. Spleen: Normal in size without focal abnormality. Adrenals/Urinary Tract: Adrenal glands are unremarkable. Kidneys are normal, without renal calculi, focal lesion, or hydronephrosis. Bladder is unremarkable. Stomach/Bowel: Stomach is within normal limits. Appendix appears normal. No evidence of bowel wall thickening, distention, or inflammatory changes. Vascular/Lymphatic: No significant vascular findings are present. No enlarged abdominal or pelvic lymph nodes. Reproductive: Prostate is unremarkable. Other: No abdominal wall hernia or abnormality. No  abdominopelvic ascites. Musculoskeletal: No acute or significant osseous findings. IMPRESSION: 1. No acute or active process within the abdomen or pelvis. Electronically Signed   By: Virgina Norfolk M.D.   On: 10/15/2022 22:52    Procedures Procedures    Medications Ordered in ED Medications  sodium chloride 0.9 % bolus 1,000 mL (has no administration in time range)  pantoprazole (PROTONIX) injection 40 mg (40 mg Intravenous Given 10/15/22 2132)  ondansetron (ZOFRAN) injection 4 mg (4 mg Intravenous Given 10/15/22 2132)  ketorolac (TORADOL) 15 MG/ML injection 15 mg (15 mg Intravenous Given 10/15/22 2132)  metoCLOPramide (REGLAN) injection 10 mg (10 mg Intravenous Given 10/15/22 2237)  diphenhydrAMINE (BENADRYL) injection 25 mg (25 mg Intravenous Given 10/15/22 2238)  iohexol (OMNIPAQUE) 300 MG/ML solution 100 mL (100 mLs Intravenous Contrast Given 10/15/22 2241)    ED Course/ Medical Decision Making/ A&P    Patient seen and examined.  History obtained directly from patient. Work-up including labs, imaging, EKG ordered in triage, if performed, were reviewed.    Labs/EKG: Independently reviewed and interpreted.  This included: CBC with normal white blood cell count at 6.6, normal hemoglobin at 14.9 otherwise unremarkable; CMP unremarkable; lipase normal; viral panel negative.  UA ordered and pending.  Imaging: None ordered  Medications/Fluids: Ordered: IV fluid bolus, IV Protonix, Zofran, Toradol.  Most recent vital signs reviewed and are as follows: BP (!) 129/92   Pulse 100   Temp 99.3 F (37.4 C) (Oral)   Resp 18   SpO2 98%   Initial impression: Generalized abdominal pain with nausea, vomiting, diarrhea and reassuring lab workup.  Pending UA.  Will fluid challenge.  Patient appears well, nontoxic.  Patient's exam has been stable during ED stay to this point.  10:34 PM informed by RN that patient vomited after drinking ginger ale.  Ordered Reglan 10 mg, Benadryl 25 mg, CT abdomen  pelvis.  11:38 PM Reassessment performed. Patient appears stable.   Labs personally reviewed and interpreted including: Awaiting UA  Imaging personally visualized and interpreted including: CT abd/pelvis agree negative.   Reviewed pertinent lab work and imaging with patient at bedside. Questions answered.   Most current vital signs reviewed and are as follows: BP 124/73   Pulse 92   Temp 99.3 F (37.4 C) (Oral)   Resp 18   SpO2 99%   Plan: UA, symptom control.   12:01 AM Reassessment performed. Patient appears stable. Not too motivated to drink more. I encouraged him to do so.   Labs personally reviewed and interpreted including: UA concentrated, no sign of infection.   Most current vital signs reviewed and are as follows: BP (!) 136/93   Pulse 97   Temp 99.3 F (37.4 C) (Oral)   Resp 18   SpO2 98%   Plan: 2nd liter ordered, encouraged PO challenge, d/c home after this is complete. Rx reglan, pepcid.   Signout to Dr. Pilar Plate at shift change.                            Medical Decision Making Amount and/or Complexity of Data Reviewed Labs: ordered.  Risk Prescription drug management.   For this patient's complaint of abdominal pain, the following conditions were considered on the differential diagnosis: gastritis/PUD, enteritis/duodenitis, appendicitis, cholelithiasis/cholecystitis, cholangitis, pancreatitis, ruptured viscus, colitis, diverticulitis, small/large bowel obstruction, proctitis, cystitis, pyelonephritis, ureteral colic, aortic dissection, aortic aneurysm. Atypical chest etiologies were also considered including ACS, PE, and pneumonia.   The patient's vital signs, pertinent lab work and imaging were reviewed and interpreted as discussed in the ED course. Hospitalization was considered for further testing, treatments, or serial exams/observation. However as patient is well-appearing, has a stable exam, and reassuring studies today, I do not feel that they  warrant admission at this time. This plan was discussed with the patient who verbalizes agreement and comfort with this plan and seems reliable and able to return to the Emergency Department with worsening or changing symptoms.          Final Clinical Impression(s) / ED Diagnoses Final diagnoses:  Nausea vomiting and diarrhea    Rx / DC Orders ED Discharge Orders          Ordered    metoCLOPramide (REGLAN) 10 MG tablet  Every 6 hours        10/15/22 2347    famotidine (PEPCID) 20 MG tablet  2 times daily  10/15/22 2347              Renne Crigler, PA-C 10/16/22 0003    Derwood Kaplan, MD 10/16/22 [id number]

## 2022-10-15 NOTE — ED Provider Triage Note (Signed)
Emergency Medicine Provider Triage Evaluation Note  Woodson Macha , a 27 y.o. male  was evaluated in triage.  Pt complains of nausea, vomiting, diarrhea starting yesterday.  He also has generalized abdominal tenderness.  Diarrhea has been soft and watery.  No blood.  No urinary symptoms.  He has had a runny nose without other respiratory symptoms.  No suspicious food or water exposures.  Denies drugs or alcohol.  Review of Systems  Positive: Abdominal cramps, nausea, vomiting, diarrhea Negative: Dysuria  Physical Exam  There were no vitals taken for this visit. Gen:   Awake, no distress   Resp:  Normal effort  MSK:   Moves extremities without difficulty  Other:  Mild generalized abdominal tenderness without rebound or guarding  Medical Decision Making  Medically screening exam initiated at 4:17 PM.  Appropriate orders placed.  Rydell Wiegel was informed that the remainder of the evaluation will be completed by another provider, this initial triage assessment does not replace that evaluation, and the importance of remaining in the ED until their evaluation is complete.     Carlisle Cater, PA-C 10/15/22 [phone]

## 2022-10-15 NOTE — Discharge Instructions (Signed)
Please read and follow all provided instructions.  Your diagnoses today include:  1. Nausea vomiting and diarrhea     TTests performed today include: Blood cell counts and platelets Kidney and liver function tests Pancreas function test (called lipase) Urine test to look for infection CT scan of your abdomen pelvis: Does not show any concerning problems Vital signs. See below for your results today.   Medications prescribed:  Metoclopramide: Medication for nausea and vomiting  Zofran (ondansetron) - for nausea and vomiting  Take any prescribed medications only as directed.  Home care instructions:  Follow any educational materials contained in this packet.  Keep drinking plenty of fluids and use the medicine for nausea as directed.   Drink clear liquids for the next 24 hours and introduce solid foods slowly after 24 hours using the b.r.a.t. diet (Bananas, Rice, Applesauce, Toast, Yogurt).    Follow-up instructions: Please follow-up with your primary care provider in the next 2 days for further evaluation of your symptoms. If you are not feeling better in 48 hours you may have a condition that is more serious and you need re-evaluation.   Return instructions:  SEEK IMMEDIATE MEDICAL ATTENTION IF: If you have pain that does not go away or becomes severe  A temperature above 101F develops  Repeated vomiting occurs (multiple episodes)  If you have pain that becomes localized to portions of the abdomen. The right side could possibly be appendicitis. In an adult, the left lower portion of the abdomen could be colitis or diverticulitis.  Blood is being passed in stools or vomit (bright red or black tarry stools)  You develop chest pain, difficulty breathing, dizziness or fainting, or become confused, poorly responsive, or inconsolable (young children) If you have any other emergent concerns regarding your health  Additional Information: Abdominal (belly) pain can be caused by many  things. Your caregiver performed an examination and possibly ordered blood/urine tests and imaging (CT scan, x-rays, ultrasound). Many cases can be observed and treated at home after initial evaluation in the emergency department. Even though you are being discharged home, abdominal pain can be unpredictable. Therefore, you need a repeated exam if your pain does not resolve, returns, or worsens. Most patients with abdominal pain don't have to be admitted to the hospital or have surgery, but serious problems like appendicitis and gallbladder attacks can start out as nonspecific pain. Many abdominal conditions cannot be diagnosed in one visit, so follow-up evaluations are very important.  Your vital signs today were: BP 124/73   Pulse 92   Temp 99.3 F (37.4 C) (Oral)   Resp 18   SpO2 99%  If your blood pressure (bp) was elevated above 135/85 this visit, please have this repeated by your doctor within one month. --------------

## 2022-10-15 NOTE — ED Triage Notes (Signed)
Per EMS- patient c/o N/V/D x 12 hours. Patient also c/o abdominal pain

## 2023-07-07 ENCOUNTER — Encounter (HOSPITAL_BASED_OUTPATIENT_CLINIC_OR_DEPARTMENT_OTHER): Payer: Self-pay | Admitting: Emergency Medicine

## 2023-07-07 ENCOUNTER — Emergency Department (HOSPITAL_BASED_OUTPATIENT_CLINIC_OR_DEPARTMENT_OTHER)
Admission: EM | Admit: 2023-07-07 | Discharge: 2023-07-08 | Disposition: A | Discharge status: Home / Self Care | Payer: Medicaid Other | Attending: Emergency Medicine | Admitting: Emergency Medicine

## 2023-07-07 DIAGNOSIS — R1084 Generalized abdominal pain: Secondary | ICD-10-CM | POA: Diagnosis not present

## 2023-07-07 DIAGNOSIS — R112 Nausea with vomiting, unspecified: Secondary | ICD-10-CM | POA: Diagnosis present

## 2023-07-07 DIAGNOSIS — R197 Diarrhea, unspecified: Secondary | ICD-10-CM | POA: Insufficient documentation

## 2023-07-07 DIAGNOSIS — Z79899 Other long term (current) drug therapy: Secondary | ICD-10-CM | POA: Insufficient documentation

## 2023-07-07 DIAGNOSIS — D72829 Elevated white blood cell count, unspecified: Secondary | ICD-10-CM | POA: Diagnosis not present

## 2023-07-07 DIAGNOSIS — K529 Noninfective gastroenteritis and colitis, unspecified: Secondary | ICD-10-CM

## 2023-07-07 LAB — COMPREHENSIVE METABOLIC PANEL
ALT: 9 U/L (ref 0–44)
AST: 16 U/L (ref 15–41)
Albumin: 4.9 g/dL (ref 3.5–5.0)
Alkaline Phosphatase: 54 U/L (ref 38–126)
Anion gap: 13 (ref 5–15)
BUN: 14 mg/dL (ref 6–20)
CO2: 25 mmol/L (ref 22–32)
Calcium: 10.1 mg/dL (ref 8.9–10.3)
Chloride: 106 mmol/L (ref 98–111)
Creatinine, Ser: 1.06 mg/dL (ref 0.61–1.24)
GFR, Estimated: >60 mL/min (ref >60)
Glucose, Bld: 116 mg/dL — ABNORMAL HIGH (ref 70–99)
Potassium: 3.6 mmol/L (ref 3.5–5.1)
Sodium: 144 mmol/L (ref 135–145)
Total Bilirubin: 0.6 mg/dL (ref 0.3–1.2)
Total Protein: 8.6 g/dL — ABNORMAL HIGH (ref 6.5–8.1)

## 2023-07-07 LAB — CBC
HCT: 41.7 % (ref 39.0–52.0)
Hemoglobin: 14.1 g/dL (ref 13.0–17.0)
MCH: 26.6 pg (ref 26.0–34.0)
MCHC: 33.8 g/dL (ref 30.0–36.0)
MCV: 78.7 fL — ABNORMAL LOW (ref 80.0–100.0)
Platelets: 374 10*3/uL (ref 150–400)
RBC: 5.3 MIL/uL (ref 4.22–5.81)
RDW: 14.1 % (ref 11.5–15.5)
WBC: 10.9 10*3/uL — ABNORMAL HIGH (ref 4.0–10.5)
nRBC: 0 % (ref 0.0–0.2)

## 2023-07-07 LAB — LIPASE, BLOOD: Lipase: 31 U/L (ref 11–51)

## 2023-07-07 MED ORDER — ONDANSETRON HCL 4 MG/2ML IJ SOLN
4.0000 mg | Freq: Once | INTRAMUSCULAR | Status: AC
  Administered 2023-07-07: 4 mg via INTRAVENOUS
  Filled 2023-07-07: qty 2

## 2023-07-07 MED ORDER — SODIUM CHLORIDE 0.9 % IV BOLUS
1000.0000 mL | Freq: Once | INTRAVENOUS | Status: AC
  Administered 2023-07-07: 1000 mL via INTRAVENOUS

## 2023-07-07 MED ORDER — KETOROLAC TROMETHAMINE 30 MG/ML IJ SOLN
30.0000 mg | Freq: Once | INTRAMUSCULAR | Status: AC
  Administered 2023-07-07: 30 mg via INTRAVENOUS
  Filled 2023-07-07: qty 1

## 2023-07-07 NOTE — ED Triage Notes (Signed)
Nausea vomiting x 3 days Unable to keep anything down. General body aches. Abdo pain

## 2023-07-07 NOTE — ED Provider Notes (Signed)
[place] EMERGENCY DEPARTMENT AT [facility] Provider Note   CSN: [id number] Arrival date & time: 07/07/23  2234     History {Add pertinent medical, surgical, social history, OB history to HPI:1} Chief Complaint  Patient presents with   Emesis    Jason Benjamin is a 27 y.o. male.  Patient is a 27 year old male with history of ADHD, anxiety.  Patient presenting today for evaluation of nausea, vomiting, diarrhea, abdominal cramping for the past 3 days.  All has been nonbloody.  No fevers or chills.  He denies any ill contacts or having consumed any undercooked or suspicious foods.  He describes generalized abdominal cramping.  No aggravating or alleviating factors.  The history is provided by the patient.       Home Medications Prior to Admission medications   Medication Sig Start Date End Date Taking? Authorizing Provider  acyclovir (ZOVIRAX) 400 MG tablet Take 1 tablet (400 mg total) by mouth 3 (three) times daily. Patient not taking: Reported on 11/10/2018 12/24/16   Deatra Canter, FNP  Ascorbic Acid (VITAMIN C PO) Take 2 tablets by mouth daily.    [provider]  azithromycin (ZITHROMAX) 250 MG tablet Take 1 tablet (250 mg total) by mouth daily. Patient not taking: Reported on 03/27/2019 11/19/18   Geoffery Lyons, MD  benzonatate (TESSALON) 100 MG capsule Take 1 capsule (100 mg total) by mouth 3 (three) times daily as needed for cough. Patient not taking: Reported on 03/27/2019 11/10/18   Tilden Fossa, MD  cephALEXin (KEFLEX) 500 MG capsule Take 1 capsule (500 mg total) by mouth 2 (two) times daily. Patient not taking: Reported on 11/10/2018 05/24/17   Everlene Farrier, PA-C  dicyclomine (BENTYL) 20 MG tablet Take 1 tablet (20 mg total) by mouth 2 (two) times daily. 05/11/19   McDonald, Mia A, PA-C  famotidine (PEPCID) 20 MG tablet Take 1 tablet (20 mg total) by mouth 2 (two) times daily. 10/15/22   Renne Crigler, PA-C  metoCLOPramide (REGLAN) 10 MG tablet Take 1  tablet (10 mg total) by mouth every 6 (six) hours. 10/15/22   Renne Crigler, PA-C  naproxen (NAPROSYN) 250 MG tablet Take 1 tablet (250 mg total) by mouth 2 (two) times daily with a meal. Patient not taking: Reported on 11/10/2018 05/24/17   Everlene Farrier, PA-C  ondansetron (ZOFRAN ODT) 4 MG disintegrating tablet Take 1 tablet (4 mg total) by mouth every 8 (eight) hours as needed. 05/11/19   McDonald, Mia A, PA-C  promethazine (PHENERGAN) 25 MG suppository Place 1 suppository (25 mg total) rectally every 6 (six) hours as needed for nausea or vomiting. 05/11/19   McDonald, Mia A, PA-C  promethazine (PHENERGAN) 25 MG tablet Take 1 tablet (25 mg total) by mouth every 6 (six) hours as needed for nausea or vomiting. 03/15/21   Gerhard Munch, MD  tobramycin (TOBREX) 0.3 % ophthalmic solution Place 1 drop into the right eye every 6 (six) hours. 09/23/22   Mardella Layman, MD      Allergies    Patient has no known allergies.    Review of Systems   Review of Systems  All other systems reviewed and are negative.   Physical Exam Updated Vital Signs BP (!) 140/88 (BP Location: Right Arm)   Pulse 72   Temp 99 F (37.2 C) (Oral)   Resp 20   SpO2 99%  Physical Exam Vitals and nursing note reviewed.  Constitutional:      General: He is not in acute distress.  Appearance: He is well-developed. He is not diaphoretic.  HENT:     Head: Normocephalic and atraumatic.  Cardiovascular:     Rate and Rhythm: Normal rate and regular rhythm.     Heart sounds: No murmur heard.    No friction rub.  Pulmonary:     Effort: Pulmonary effort is normal. No respiratory distress.     Breath sounds: Normal breath sounds. No wheezing or rales.  Abdominal:     General: Bowel sounds are normal. There is no distension.     Palpations: Abdomen is soft.     Tenderness: There is abdominal tenderness. There is no guarding or rebound.     Comments: There is mild generalized abdominal tenderness.  Musculoskeletal:         General: Normal range of motion.     Cervical back: Normal range of motion and neck supple.  Skin:    General: Skin is warm and dry.  Neurological:     Mental Status: He is alert and oriented to person, place, and time.     Coordination: Coordination normal.     ED Results / Procedures / Treatments   Labs (all labs ordered are listed, but only abnormal results are displayed) Labs Reviewed  CBC - Abnormal; Notable for the following components:      Result Value   WBC 10.9 (*)    MCV 78.7 (*)    All other components within normal limits  LIPASE, BLOOD  COMPREHENSIVE METABOLIC PANEL  URINALYSIS, ROUTINE W REFLEX MICROSCOPIC    EKG None  Radiology No results found.  Procedures Procedures  {Document cardiac monitor, telemetry assessment procedure when appropriate:1}  Medications Ordered in ED Medications  sodium chloride 0.9 % bolus 1,000 mL (has no administration in time range)  sodium chloride 0.9 % bolus 1,000 mL (has no administration in time range)  ondansetron (ZOFRAN) injection 4 mg (has no administration in time range)  ketorolac (TORADOL) 30 MG/ML injection 30 mg (has no administration in time range)    ED Course/ Medical Decision Making/ A&P   {   Click here for ABCD2, HEART and other calculatorsREFRESH Note before signing :1}                              Medical Decision Making Amount and/or Complexity of Data Reviewed Labs: ordered.  Risk Prescription drug management.   ***  {Document critical care time when appropriate:1} {Document review of labs and clinical decision tools ie heart score, Chads2Vasc2 etc:1}  {Document your independent review of radiology images, and any outside records:1} {Document your discussion with family members, caretakers, and with consultants:1} {Document social determinants of health affecting pt's care:1} {Document your decision making why or why not admission, treatments were needed:1} Final Clinical Impression(s) /  ED Diagnoses Final diagnoses:  None    Rx / DC Orders ED Discharge Orders     None

## 2023-07-08 ENCOUNTER — Emergency Department (HOSPITAL_BASED_OUTPATIENT_CLINIC_OR_DEPARTMENT_OTHER): Payer: Medicaid Other

## 2023-07-08 LAB — URINALYSIS, ROUTINE W REFLEX MICROSCOPIC
Glucose, UA: NEGATIVE mg/dL
Hgb urine dipstick: NEGATIVE
Ketones, ur: 40 mg/dL — AB
Leukocytes,Ua: NEGATIVE
Nitrite: NEGATIVE
Protein, ur: 100 mg/dL — AB
Specific Gravity, Urine: 1.042 — ABNORMAL HIGH (ref 1.005–1.030)
pH: 6.5 (ref 5.0–8.0)

## 2023-07-08 LAB — RAPID URINE DRUG SCREEN, HOSP PERFORMED
Amphetamines: NOT DETECTED
Barbiturates: NOT DETECTED
Benzodiazepines: POSITIVE — AB
Cocaine: POSITIVE — AB
Opiates: NOT DETECTED
Tetrahydrocannabinol: NOT DETECTED

## 2023-07-08 MED ORDER — IOHEXOL 300 MG/ML  SOLN
100.0000 mL | Freq: Once | INTRAMUSCULAR | Status: AC | PRN
  Administered 2023-07-08: 100 mL via INTRAVENOUS

## 2023-07-08 MED ORDER — ONDANSETRON HCL 4 MG/2ML IJ SOLN
4.0000 mg | Freq: Once | INTRAMUSCULAR | Status: AC
  Administered 2023-07-08: 4 mg via INTRAVENOUS
  Filled 2023-07-08: qty 2

## 2023-07-08 MED ORDER — MORPHINE SULFATE (PF) 4 MG/ML IV SOLN
4.0000 mg | Freq: Once | INTRAVENOUS | Status: AC
  Administered 2023-07-08: 4 mg via INTRAVENOUS
  Filled 2023-07-08: qty 1

## 2023-07-08 MED ORDER — ONDANSETRON 8 MG PO TBDP: ORAL_TABLET | ORAL | 0 refills | Status: DC

## 2023-07-08 NOTE — Discharge Instructions (Signed)
Clear liquids for the next 12 hours then slowly advance to normal as tolerated.  Begin taking Zofran as prescribed as needed for nausea.  Follow-up with primary doctor if not improving in the next few days, and return to the ER if symptoms significantly worsen or change.

## 2023-07-08 NOTE — ED Notes (Signed)
Lab called at this time and spoke with Roxanne to add on the drug screen urine test to the urine already in lab.

## 2023-10-16 ENCOUNTER — Emergency Department (HOSPITAL_BASED_OUTPATIENT_CLINIC_OR_DEPARTMENT_OTHER)
Admission: EM | Admit: 2023-10-16 | Discharge: 2023-10-17 | Disposition: A | Discharge status: Home / Self Care | Payer: Medicaid Other | Attending: Emergency Medicine | Admitting: Emergency Medicine

## 2023-10-16 ENCOUNTER — Emergency Department (HOSPITAL_BASED_OUTPATIENT_CLINIC_OR_DEPARTMENT_OTHER): Payer: Medicaid Other | Admitting: Radiology

## 2023-10-16 ENCOUNTER — Encounter (HOSPITAL_BASED_OUTPATIENT_CLINIC_OR_DEPARTMENT_OTHER): Payer: Self-pay

## 2023-10-16 DIAGNOSIS — J189 Pneumonia, unspecified organism: Secondary | ICD-10-CM

## 2023-10-16 DIAGNOSIS — Z20822 Contact with and (suspected) exposure to covid-19: Secondary | ICD-10-CM | POA: Diagnosis not present

## 2023-10-16 DIAGNOSIS — R059 Cough, unspecified: Secondary | ICD-10-CM | POA: Diagnosis present

## 2023-10-16 DIAGNOSIS — J181 Lobar pneumonia, unspecified organism: Secondary | ICD-10-CM | POA: Insufficient documentation

## 2023-10-16 LAB — RESP PANEL BY RT-PCR (RSV, FLU A&B, COVID)  RVPGX2
Influenza A by PCR: NEGATIVE
Influenza B by PCR: NEGATIVE
Resp Syncytial Virus by PCR: NEGATIVE
SARS Coronavirus 2 by RT PCR: NEGATIVE

## 2023-10-16 MED ORDER — IPRATROPIUM-ALBUTEROL 0.5-2.5 (3) MG/3ML IN SOLN
RESPIRATORY_TRACT | Status: AC
  Administered 2023-10-16: 3 mL via RESPIRATORY_TRACT
  Filled 2023-10-16: qty 3

## 2023-10-16 MED ORDER — IPRATROPIUM-ALBUTEROL 0.5-2.5 (3) MG/3ML IN SOLN: 3.0000 mL | Freq: Once | RESPIRATORY_TRACT | Status: AC

## 2023-10-16 MED ORDER — ALBUTEROL SULFATE HFA 108 (90 BASE) MCG/ACT IN AERS
2.0000 | INHALATION_SPRAY | RESPIRATORY_TRACT | Status: DC | PRN
  Administered 2023-10-17: 2 via RESPIRATORY_TRACT
  Filled 2023-10-16: qty 6.7

## 2023-10-16 NOTE — ED Notes (Signed)
 RT Note: CXR has not been completed, resulted in error.

## 2023-10-16 NOTE — ED Triage Notes (Signed)
 He c/o "I had some diarrhea about a week ago, but that's gone now. Now I just have a cough and wheezing" [sic]. He is in no distress.

## 2023-10-17 ENCOUNTER — Telehealth (HOSPITAL_BASED_OUTPATIENT_CLINIC_OR_DEPARTMENT_OTHER): Payer: Self-pay | Admitting: Emergency Medicine

## 2023-10-17 MED ORDER — AZITHROMYCIN 250 MG PO TABS: 250.0000 mg | ORAL_TABLET | Freq: Every day | ORAL | 0 refills | Status: DC

## 2023-10-17 MED ORDER — AZITHROMYCIN 250 MG PO TABS
500.0000 mg | ORAL_TABLET | Freq: Once | ORAL | Status: AC
  Administered 2023-10-17: 500 mg via ORAL
  Filled 2023-10-17: qty 2

## 2023-10-17 MED ORDER — IPRATROPIUM-ALBUTEROL 0.5-2.5 (3) MG/3ML IN SOLN
3.0000 mL | Freq: Once | RESPIRATORY_TRACT | Status: AC
  Administered 2023-10-17: 3 mL via RESPIRATORY_TRACT
  Filled 2023-10-17: qty 3

## 2023-10-17 MED ORDER — AMOXICILLIN-POT CLAVULANATE 875-125 MG PO TABS: 1.0000 | ORAL_TABLET | Freq: Two times a day (BID) | ORAL | 0 refills | Status: DC

## 2023-10-17 MED ORDER — PREDNISONE 50 MG PO TABS
60.0000 mg | ORAL_TABLET | Freq: Once | ORAL | Status: AC
  Administered 2023-10-17: 60 mg via ORAL
  Filled 2023-10-17: qty 1

## 2023-10-17 MED ORDER — HYDROCOD POLI-CHLORPHE POLI ER 10-8 MG/5ML PO SUER
5.0000 mL | Freq: Once | ORAL | Status: AC
  Administered 2023-10-17: 5 mL via ORAL
  Filled 2023-10-17: qty 5

## 2023-10-17 MED ORDER — HYDROCODONE BIT-HOMATROP MBR 5-1.5 MG/5ML PO SOLN: 5.0000 mL | Freq: Four times a day (QID) | ORAL | 0 refills | Status: DC | PRN

## 2023-10-17 MED ORDER — AMOXICILLIN-POT CLAVULANATE 875-125 MG PO TABS
1.0000 | ORAL_TABLET | Freq: Once | ORAL | Status: AC
  Administered 2023-10-17: 1 via ORAL
  Filled 2023-10-17: qty 1

## 2023-10-17 NOTE — ED Notes (Signed)
 RT note: Pt. given/instructed on Albuterol  Inhaler with/without Spacer device per order, able to use independently and has used before, made aware of Instructional inserts with both Inhaler and Spacer device for Best Care of Acute Asthma episodes, stated understanding.

## 2023-10-17 NOTE — ED Provider Notes (Signed)
 [place] EMERGENCY DEPARTMENT AT [facility] Provider Note   CSN: [id number] Arrival date & time: 10/16/23  1823     History  Chief Complaint  Patient presents with   URI    Jason Benjamin is a 28 y.o. male.  28 year old male presents ER today with a weeks worth of cough.  He states has been productive of multiple different colors.  He is felt like his got worse and worse.  He tried over-the-counter Mucinex , Tylenol , or Profen multiple other medications without help.  He does smoke a couple black and milds a day but has not been smoking since this started.  No lower extremity swelling.  No other associated symptoms.  Patient states he coughed so hard that sometimes his back and chest hurt.  Also seems to hurt worse with taking deep breaths.  Subjective fever at home but no thermometer to check how high it got.  Multiple sick contacts.   URI      Home Medications Prior to Admission medications   Medication Sig Start Date End Date Taking? Authorizing Provider  amoxicillin -clavulanate (AUGMENTIN ) 875-125 MG tablet Take 1 tablet by mouth every 12 (twelve) hours. 10/17/23  Yes Pinchas Reither, Selinda, MD  azithromycin  (ZITHROMAX ) 250 MG tablet Take 1 tablet (250 mg total) by mouth daily. Take first 2 tablets together, then 1 every day until finished. 10/17/23  Yes Jalan Fariss, Selinda, MD  HYDROcodone  bit-homatropine (HYCODAN) 5-1.5 MG/5ML syrup Take 5 mLs by mouth every 6 (six) hours as needed for cough. 10/17/23  Yes Heavenlee Maiorana, Selinda, MD  Ascorbic Acid (VITAMIN C PO) Take 2 tablets by mouth daily.    [provider]  ondansetron  (ZOFRAN -ODT) 8 MG disintegrating tablet 8mg  ODT q4 hours prn nausea 07/08/23   Geroldine Berg, MD      Allergies    Patient has no known allergies.    Review of Systems   Review of Systems  Physical Exam Updated Vital Signs BP 118/77   Pulse 90   Temp 100.2 F (37.9 C)   Resp 16   SpO2 98%  Physical Exam Vitals and nursing note reviewed.  Constitutional:       Appearance: He is well-developed.  HENT:     Head: Normocephalic and atraumatic.     Mouth/Throat:     Mouth: Mucous membranes are dry.  Cardiovascular:     Rate and Rhythm: Normal rate.  Pulmonary:     Effort: Pulmonary effort is normal. No respiratory distress.  Abdominal:     General: There is no distension.  Musculoskeletal:        General: Normal range of motion.     Cervical back: Normal range of motion.  Skin:    General: Skin is warm and dry.  Neurological:     General: No focal deficit present.     Mental Status: He is alert.     ED Results / Procedures / Treatments   Labs (all labs ordered are listed, but only abnormal results are displayed) Labs Reviewed  RESP PANEL BY RT-PCR (RSV, FLU A&B, COVID)  [id number]    EKG None  Radiology DG Chest 2 View Result Date: 10/16/2023 CLINICAL DATA:  Shortness of breath. EXAM: CHEST - 2 VIEW COMPARISON:  Chest radiograph dated 01/16/2010. FINDINGS: The heart size and mediastinal contours are within normal limits. Mild left basilar atelectasis/airspace disease. The right lung is clear. No pleural effusion or pneumothorax. The visualized skeletal structures are unremarkable. IMPRESSION: Mild left basilar atelectasis/airspace disease. Electronically Signed   By:  Norman Hopper M.D.   On: 10/16/2023 19:19    Procedures Procedures    Medications Ordered in ED Medications  albuterol  (VENTOLIN  HFA) 108 (90 Base) MCG/ACT inhaler 2 puff (2 puffs Inhalation Given 10/17/23 0252)  ipratropium-albuterol  (DUONEB) 0.5-2.5 (3) MG/3ML nebulizer solution 3 mL (3 mLs Nebulization Given 10/16/23 1838)  chlorpheniramine-HYDROcodone  (TUSSIONEX) 10-8 MG/5ML suspension 5 mL (5 mLs Oral Given 10/17/23 0050)  amoxicillin -clavulanate (AUGMENTIN ) 875-125 MG per tablet 1 tablet (1 tablet Oral Given 10/17/23 0048)  azithromycin  (ZITHROMAX ) tablet 500 mg (500 mg Oral Given 10/17/23 0048)  ipratropium-albuterol  (DUONEB) 0.5-2.5 (3) MG/3ML nebulizer solution 3 mL  (3 mLs Nebulization Given 10/17/23 0051)  predniSONE  (DELTASONE ) tablet 60 mg (60 mg Oral Given 10/17/23 0220)    ED Course/ Medical Decision Making/ A&P                                 Medical Decision Making Amount and/or Complexity of Data Reviewed Radiology: ordered.  Risk Prescription drug management.   Patient with pneumonia on his x-ray which is consistent with his symptoms.  Will treat for same.  Had some improvement with breathing treatments so we will give him steroids as well as antibiotics.  Patient mildly dehydrated with a tacky mouth but no tachycardia or other evidence of more severe dehydration and is tolerating p.o.'s very well with multiple empty cans of soda and half of the bottle water  at bedside.  Patient has has been trying to drink a little bit more water  than normal however I encouraged increasing elevate further for the next couple days.  No indication for IV fluids at this time.  No indication for further workup or management.  Patient is not septic, hypoxic or any respiratory distress to indicate the need for admission.  Final Clinical Impression(s) / ED Diagnoses Final diagnoses:  Pneumonia of left lower lobe due to infectious organism    Rx / DC Orders ED Discharge Orders          Ordered    amoxicillin -clavulanate (AUGMENTIN ) 875-125 MG tablet  Every 12 hours        10/17/23 0217    azithromycin  (ZITHROMAX ) 250 MG tablet  Daily        10/17/23 0217    HYDROcodone  bit-homatropine (HYCODAN) 5-1.5 MG/5ML syrup  Every 6 hours PRN        10/17/23 0217              Kylie Simmonds, Selinda, MD 10/17/23 9697

## 2023-10-17 NOTE — Telephone Encounter (Signed)
 Sent ABX to [facility] on [address].

## 2023-10-17 NOTE — Progress Notes (Signed)
 RT note: Correction, CXR completed/resulted at 1919 on 10/16/2023, Procedure was not marked as completed, no delay in care resulted.

## 2023-12-14 ENCOUNTER — Other Ambulatory Visit: Payer: Self-pay

## 2023-12-14 ENCOUNTER — Encounter (HOSPITAL_BASED_OUTPATIENT_CLINIC_OR_DEPARTMENT_OTHER): Payer: Self-pay

## 2023-12-14 ENCOUNTER — Emergency Department (HOSPITAL_BASED_OUTPATIENT_CLINIC_OR_DEPARTMENT_OTHER)

## 2023-12-14 DIAGNOSIS — F1110 Opioid abuse, uncomplicated: Secondary | ICD-10-CM | POA: Diagnosis not present

## 2023-12-14 DIAGNOSIS — R519 Headache, unspecified: Secondary | ICD-10-CM | POA: Insufficient documentation

## 2023-12-14 DIAGNOSIS — S025XXA Fracture of tooth (traumatic), initial encounter for closed fracture: Secondary | ICD-10-CM | POA: Insufficient documentation

## 2023-12-14 DIAGNOSIS — S0081XA Abrasion of other part of head, initial encounter: Secondary | ICD-10-CM | POA: Diagnosis not present

## 2023-12-14 DIAGNOSIS — K0889 Other specified disorders of teeth and supporting structures: Secondary | ICD-10-CM | POA: Diagnosis present

## 2023-12-14 DIAGNOSIS — R4182 Altered mental status, unspecified: Secondary | ICD-10-CM | POA: Diagnosis present

## 2023-12-14 DIAGNOSIS — F17200 Nicotine dependence, unspecified, uncomplicated: Secondary | ICD-10-CM | POA: Diagnosis not present
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
## 2023-12-15 ENCOUNTER — Emergency Department (HOSPITAL_COMMUNITY)

## 2023-12-15 ENCOUNTER — Encounter (HOSPITAL_COMMUNITY): Payer: Self-pay

## 2023-12-15 ENCOUNTER — Emergency Department (HOSPITAL_BASED_OUTPATIENT_CLINIC_OR_DEPARTMENT_OTHER)
Admission: EM | Admit: 2023-12-15 | Discharge: 2023-12-15 | Disposition: A | Discharge status: Home / Self Care | Attending: Emergency Medicine | Admitting: Emergency Medicine

## 2023-12-15 ENCOUNTER — Other Ambulatory Visit: Payer: Self-pay

## 2023-12-15 ENCOUNTER — Emergency Department (HOSPITAL_COMMUNITY)
Admission: EM | Admit: 2023-12-15 | Discharge: 2023-12-16 | Disposition: A | Discharge status: Home / Self Care | Source: Home / Self Care | Attending: Emergency Medicine | Admitting: Emergency Medicine

## 2023-12-15 DIAGNOSIS — F1910 Other psychoactive substance abuse, uncomplicated: Secondary | ICD-10-CM

## 2023-12-15 DIAGNOSIS — F1110 Opioid abuse, uncomplicated: Secondary | ICD-10-CM | POA: Insufficient documentation

## 2023-12-15 DIAGNOSIS — S025XXA Fracture of tooth (traumatic), initial encounter for closed fracture: Secondary | ICD-10-CM

## 2023-12-15 DIAGNOSIS — F17200 Nicotine dependence, unspecified, uncomplicated: Secondary | ICD-10-CM | POA: Insufficient documentation

## 2023-12-15 LAB — CBC WITH DIFFERENTIAL/PLATELET
Abs Immature Granulocytes: 0.04 10*3/uL (ref 0.00–0.07)
Basophils Absolute: 0 10*3/uL (ref 0.0–0.1)
Basophils Relative: 0 %
Eosinophils Absolute: 0 10*3/uL (ref 0.0–0.5)
Eosinophils Relative: 0 %
HCT: 43.4 % (ref 39.0–52.0)
Hemoglobin: 14.6 g/dL (ref 13.0–17.0)
Immature Granulocytes: 0 %
Lymphocytes Relative: 15 %
Lymphs Abs: 1.5 10*3/uL (ref 0.7–4.0)
MCH: 27.1 pg (ref 26.0–34.0)
MCHC: 33.6 g/dL (ref 30.0–36.0)
MCV: 80.5 fL (ref 80.0–100.0)
Monocytes Absolute: 0.3 10*3/uL (ref 0.1–1.0)
Monocytes Relative: 3 %
Neutro Abs: 7.6 10*3/uL (ref 1.7–7.7)
Neutrophils Relative %: 82 %
Platelets: 296 10*3/uL (ref 150–400)
RBC: 5.39 MIL/uL (ref 4.22–5.81)
RDW: 13.3 % (ref 11.5–15.5)
WBC: 9.5 10*3/uL (ref 4.0–10.5)
nRBC: 0 % (ref 0.0–0.2)

## 2023-12-15 LAB — COMPREHENSIVE METABOLIC PANEL
ALT: 10 U/L (ref 0–44)
AST: 23 U/L (ref 15–41)
Albumin: 4.4 g/dL (ref 3.5–5.0)
Alkaline Phosphatase: 54 U/L (ref 38–126)
Anion gap: 15 (ref 5–15)
BUN: 8 mg/dL (ref 6–20)
CO2: 22 mmol/L (ref 22–32)
Calcium: 9.9 mg/dL (ref 8.9–10.3)
Chloride: 104 mmol/L (ref 98–111)
Creatinine, Ser: 1 mg/dL (ref 0.61–1.24)
GFR, Estimated: >60 mL/min (ref >60)
Glucose, Bld: 104 mg/dL — ABNORMAL HIGH (ref 70–99)
Potassium: 3.6 mmol/L (ref 3.5–5.1)
Sodium: 141 mmol/L (ref 135–145)
Total Bilirubin: 1.2 mg/dL (ref 0.0–1.2)
Total Protein: 8.2 g/dL — ABNORMAL HIGH (ref 6.5–8.1)

## 2023-12-15 LAB — ETHANOL: Alcohol, Ethyl (B): <10 mg/dL (ref <10)

## 2023-12-15 MED ORDER — ONDANSETRON HCL 4 MG/2ML IJ SOLN
4.0000 mg | Freq: Once | INTRAMUSCULAR | Status: AC
  Administered 2023-12-15: 4 mg via INTRAVENOUS
  Filled 2023-12-15: qty 2

## 2023-12-15 MED ORDER — SODIUM CHLORIDE 0.9 % IV BOLUS
1000.0000 mL | Freq: Once | INTRAVENOUS | Status: AC
  Administered 2023-12-15: 1000 mL via INTRAVENOUS

## 2023-12-15 MED ORDER — AMOXICILLIN 500 MG PO CAPS: 1000.0000 mg | ORAL_CAPSULE | Freq: Two times a day (BID) | ORAL | 0 refills | Status: DC

## 2023-12-15 MED ORDER — ONDANSETRON 4 MG PO TBDP: 4.0000 mg | ORAL_TABLET | Freq: Three times a day (TID) | ORAL | 0 refills | Status: DC | PRN

## 2023-12-15 MED ORDER — IBUPROFEN 800 MG PO TABS
800.0000 mg | ORAL_TABLET | Freq: Once | ORAL | Status: AC
  Administered 2023-12-15: 800 mg via ORAL
  Filled 2023-12-15: qty 1

## 2023-12-15 MED ORDER — ACETAMINOPHEN 500 MG PO TABS
1000.0000 mg | ORAL_TABLET | Freq: Once | ORAL | Status: AC
  Administered 2023-12-15: 1000 mg via ORAL
  Filled 2023-12-15: qty 2

## 2023-12-15 MED ORDER — OXYCODONE HCL 5 MG PO TABS
5.0000 mg | ORAL_TABLET | Freq: Once | ORAL | Status: AC
  Administered 2023-12-15: 5 mg via ORAL
  Filled 2023-12-15: qty 1

## 2023-12-15 MED ORDER — MORPHINE SULFATE 15 MG PO TABS: 7.5000 mg | ORAL_TABLET | ORAL | 0 refills | Status: DC | PRN

## 2023-12-15 NOTE — ED Notes (Signed)
 Code word attached to chart for sister.

## 2023-12-15 NOTE — ED Notes (Signed)
 PT came back from CT and began to vomit.
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
## 2023-12-15 NOTE — Discharge Instructions (Signed)
 Follow-up with your dentist in the office.  Sometimes the dentist like to have you do a course of antibiotics before they would try to repair anything.  Starting you on antibiotics just in case this helps speed up your repair if they feel like you need 1.  Take 4 over the counter ibuprofen tablets 3 times a day or 2 over-the-counter naproxen tablets twice a day for pain. Also take tylenol 1000mg (2 extra strength) four times a day.   Then take the pain medicine if you feel like you need it. Narcotics do not help with the pain, they only make you care about it less.  You can become addicted to this, people may break into your house to steal it.  It will constipate you.  If you drive under the influence of this medicine you can get a DUI.

## 2023-12-15 NOTE — ED Notes (Signed)
Mom is @ bedside.
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
## 2023-12-15 NOTE — ED Notes (Signed)
 Reviewed dc instructions with pt and mother extensively, they both verbalized understanding

## 2023-12-15 NOTE — Discharge Instructions (Addendum)
 We evaluated you for your drug problem.  Please follow-up with the resources we have given you.  We have prescribed you medication for nausea.  Please return if you develop any new or worsening symptoms such as severe pain, uncontrolled vomiting, or any other symptoms.

## 2023-12-15 NOTE — ED Notes (Signed)
 Patient transported to CT
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
## 2023-12-16 ENCOUNTER — Emergency Department (HOSPITAL_COMMUNITY)

## 2023-12-16 ENCOUNTER — Other Ambulatory Visit: Payer: Self-pay

## 2023-12-16 ENCOUNTER — Emergency Department (HOSPITAL_COMMUNITY)
Admission: EM | Admit: 2023-12-16 | Discharge: 2023-12-16 | Disposition: A | Discharge status: Home / Self Care | Attending: Emergency Medicine | Admitting: Emergency Medicine

## 2023-12-16 ENCOUNTER — Encounter (HOSPITAL_COMMUNITY): Payer: Self-pay

## 2023-12-16 ENCOUNTER — Ambulatory Visit (HOSPITAL_COMMUNITY)
Admission: EM | Admit: 2023-12-16 | Discharge: 2023-12-16 | Disposition: A | Discharge status: Home / Self Care | Attending: Psychiatry | Admitting: Psychiatry

## 2023-12-16 DIAGNOSIS — F1129 Opioid dependence with unspecified opioid-induced disorder: Secondary | ICD-10-CM

## 2023-12-16 DIAGNOSIS — F11288 Opioid dependence with other opioid-induced disorder: Secondary | ICD-10-CM | POA: Diagnosis present

## 2023-12-16 DIAGNOSIS — F1120 Opioid dependence, uncomplicated: Secondary | ICD-10-CM | POA: Insufficient documentation

## 2023-12-16 DIAGNOSIS — E876 Hypokalemia: Secondary | ICD-10-CM | POA: Diagnosis not present

## 2023-12-16 LAB — BASIC METABOLIC PANEL
Anion gap: 14 (ref 5–15)
BUN: 11 mg/dL (ref 6–20)
CO2: 24 mmol/L (ref 22–32)
Calcium: 9.5 mg/dL (ref 8.9–10.3)
Chloride: 105 mmol/L (ref 98–111)
Creatinine, Ser: 0.93 mg/dL (ref 0.61–1.24)
GFR, Estimated: >60 mL/min (ref >60)
Glucose, Bld: 130 mg/dL — ABNORMAL HIGH (ref 70–99)
Potassium: 3 mmol/L — ABNORMAL LOW (ref 3.5–5.1)
Sodium: 143 mmol/L (ref 135–145)

## 2023-12-16 LAB — CBC WITH DIFFERENTIAL/PLATELET
Abs Immature Granulocytes: 0.08 10*3/uL — ABNORMAL HIGH (ref 0.00–0.07)
Basophils Absolute: 0 10*3/uL (ref 0.0–0.1)
Basophils Relative: 0 %
Eosinophils Absolute: 0 10*3/uL (ref 0.0–0.5)
Eosinophils Relative: 0 %
HCT: 38.2 % — ABNORMAL LOW (ref 39.0–52.0)
Hemoglobin: 12.8 g/dL — ABNORMAL LOW (ref 13.0–17.0)
Immature Granulocytes: 1 %
Lymphocytes Relative: 9 %
Lymphs Abs: 1.2 10*3/uL (ref 0.7–4.0)
MCH: 27.2 pg (ref 26.0–34.0)
MCHC: 33.5 g/dL (ref 30.0–36.0)
MCV: 81.1 fL (ref 80.0–100.0)
Monocytes Absolute: 0.5 10*3/uL (ref 0.1–1.0)
Monocytes Relative: 4 %
Neutro Abs: 11.9 10*3/uL — ABNORMAL HIGH (ref 1.7–7.7)
Neutrophils Relative %: 86 %
Platelets: 285 10*3/uL (ref 150–400)
RBC: 4.71 MIL/uL (ref 4.22–5.81)
RDW: 13.8 % (ref 11.5–15.5)
WBC: 13.6 10*3/uL — ABNORMAL HIGH (ref 4.0–10.5)
nRBC: 0 % (ref 0.0–0.2)

## 2023-12-16 LAB — RESP PANEL BY RT-PCR (RSV, FLU A&B, COVID)  RVPGX2
Influenza A by PCR: NEGATIVE
Influenza B by PCR: NEGATIVE
Resp Syncytial Virus by PCR: NEGATIVE
SARS Coronavirus 2 by RT PCR: NEGATIVE

## 2023-12-16 MED ORDER — POTASSIUM CHLORIDE 20 MEQ PO PACK
40.0000 meq | PACK | Freq: Two times a day (BID) | ORAL | Status: DC
  Administered 2023-12-16: 40 meq via ORAL
  Filled 2023-12-16: qty 2

## 2023-12-16 MED ORDER — ONDANSETRON HCL 4 MG PO TABS: 4.0000 mg | ORAL_TABLET | Freq: Three times a day (TID) | ORAL | 0 refills | Status: AC | PRN

## 2023-12-16 MED ORDER — BUPRENORPHINE HCL-NALOXONE HCL 2-0.5 MG SL FILM
ORAL_FILM | SUBLINGUAL | 0 refills | Status: DC
Start: 2023-12-16 — End: 2023-12-17

## 2023-12-16 MED ORDER — ONDANSETRON 4 MG PO TBDP
8.0000 mg | ORAL_TABLET | Freq: Once | ORAL | Status: AC
  Administered 2023-12-16: 8 mg via ORAL
  Filled 2023-12-16: qty 2

## 2023-12-16 NOTE — ED Notes (Signed)
 EDP Elayne Snare at bedside discussing patient discharge instructions with mom and patient.

## 2023-12-16 NOTE — ED Notes (Addendum)
 Pt vomiting again, EDP advised

## 2023-12-16 NOTE — ED Notes (Signed)
 Patient discharged by provider.
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
## 2023-12-16 NOTE — Discharge Instructions (Addendum)
 You were seen in the Emergency Department for medical clearance before beginning detox from opiates Your oxygen levels were normal and chest x-ray looks okay and your EKG looks okay Your labs showed slight Lee low potassium.  We gave you an oral potassium supplement here to bring the potassium up We also gave you a prescription for Zofran to help with the vomiting You are medically cleared to begin detox We have called in suboxone to your pharmacy for you to begin taking at home until you are able to complete intake for detox Please pick up the prescribed suboxone and follow the dosing instructions carefully   Day 1: Administer up to 8 mg/2 mg of Suboxone sublingual film in divided doses. Clinicians should start with an initial dose of 2 mg/0.5 mg or 4 mg/1 mg buprenorphine/naloxone and may titrate upwards in 2 or 4 mg increments of buprenorphine, at approximately 2-hour intervals to a total of 8 mg/2 mg based on the control of acute withdrawal symptoms.  Day 2: Administer up to 16 mg/4 mg of Suboxone sublingual film as a single dose

## 2023-12-16 NOTE — Discharge Instructions (Signed)

## 2023-12-16 NOTE — ED Provider Notes (Signed)
 Behavioral Health Urgent Care Medical Screening Exam  Patient Name: Jason Benjamin MRN: [id number] Date of Evaluation: 12/16/23 Chief Complaint:  Seeking detox from fentanyl Diagnosis:  Final diagnoses:  Uncomplicated opioid dependence (HCC)    History of Present illness: Jason Benjamin is a 28 y.o. male.  Presents to [facility] Urgent care reporting detoxing from opiates.  Unsure when his last use was.  States " I just want to feel better."  Reports last residential treatment was 1 year prior.  Denies that he is followed up with drug counseling services.  He denied suicidal or homicidal ideations.  Reports visual hallucination of seeing "black things" with substance use.  Denied that he is currently prescribed any psychotropic medications.  Case staffed with attending psychiatrist Zouev.  Social work establish appointment with [facility] for admission.  It was reported that patient's mother will transport to [facility] for admission assessment.   Jason Benjamin is sitting.  Noted to be disheveled, no shoes.  Patient is  in no acute distress. He is alert/oriented x 4; calm/cooperative; and mood congruent with affect. e is speaking in a clear tone at moderate volume, and normal pace; with good eye contact. His thought process is coherent and relevant; There is no indication that he is currently responding to internal/external stimuli or experiencing delusional thought content; and he has denied suicidal/self-harm/homicidal ideation, psychosis, and paranoia.   Patient has remained calm throughout assessment and has answered questions appropriately.     Jason Benjamin is educated and verbalizes understanding of mental health resources and other crisis services in the community. He is instructed to call 911 and present to the nearest emergency room should He experience any suicidal/homicidal ideation, auditory/visual/hallucinations, or detrimental worsening of His  mental health condition. he was a also advised  by Clinical research associate that he could call the toll-free phone on insurance card to assist with identifying in network counselors and agencies or number on back of Medicaid card t speak with care coordinator   Flowsheet Row ED from 12/16/2023 in [facility] Most recent reading at 12/16/2023  8:44 AM ED from 12/15/2023 in [facility] Emergency Department at [facility] Most recent reading at 12/15/2023  7:06 PM ED from 12/15/2023 in [facility] Emergency Department at [facility] Most recent reading at 12/14/2023  8:19 PM  C-SSRS RISK CATEGORY No Risk No Risk No Risk       Psychiatric Specialty Exam  Presentation  General Appearance:Appropriate for Environment  Eye Contact:Good  Speech:Clear and Coherent  Speech Volume:Normal  Handedness:No data recorded  Mood and Affect  Mood:Anxious; Depressed  Affect:Congruent   Thought Process  Thought Processes:Coherent  Descriptions of Associations:Intact  Orientation:Full (Time, Place and Person)  Thought Content:Logical    Hallucinations:None  Ideas of Reference:None  Suicidal Thoughts:No  Homicidal Thoughts:No   Sensorium  Memory:Immediate Good; Recent Good  Judgment:Good  Insight:Fair   Executive Functions  Concentration:Fair  Attention Span:Fair  Recall:Good  Fund of Knowledge:Good  Language:Good   Psychomotor Activity  Psychomotor Activity:Normal   Assets  Assets:Desire for Improvement; Social Support   Sleep  Sleep:Fair  Number of hours: No data recorded  Physical Exam: Physical Exam Vitals and nursing note reviewed.  Constitutional:      Appearance: Normal appearance.  Cardiovascular:     Rate and Rhythm: Normal rate and regular rhythm.  Neurological:     Mental Status: He is alert and oriented to person, place, and time.  Psychiatric:        Mood  and Affect: Mood normal.        Behavior: Behavior normal.        Thought Content: Thought content normal.     Review of Systems  Psychiatric/Behavioral:  Positive for substance abuse. Negative for suicidal ideas. The patient is nervous/anxious.   All other systems reviewed and are negative.  There were no vitals taken for this visit. There is no height or weight on file to calculate BMI.  Musculoskeletal: Strength & Muscle Tone: within normal limits Gait & Station: normal Patient leans: N/A   [facility] MSE Discharge Disposition for Follow up and Recommendations: Based on my evaluation the patient does not appear to have an emergency medical condition and can be discharged with resources and follow up care in outpatient services for Substance Abuse Intensive Outpatient Program  -Social work establish appointment with [facility] for admission.  -Social social work provided [employer] fast streamer and shoes  Oneta Rack, NP 12/16/2023, 10:21 AM

## 2023-12-16 NOTE — ED Triage Notes (Addendum)
 Pt BIB [facility] from [facility] d/t his O2 dropping to 78% on RA & requires medical clearance before he can receive Treatment for his Fentanyl withdrawals, A/Ox4, has not used since Monday evening (12/13/23). EMS reports he will not move when asked, had to be picked up out of the floor from not wanting to move. His O2 for them was 100% on RA the entire time & all other VSS. Denies SI to EMS.

## 2023-12-16 NOTE — Progress Notes (Signed)
   12/16/23 0826  [facility] Triage Screening (Walk-ins at [facility] only)  How Did You Hear About [place]? Self  What Is the Reason for Your Visit/Call Today? Jason Benjamin presents to [facility] voluntarily unaccompanied. Pt states that he needs help with detoxing from opiods (fentanyl). Pt states that he wants off of it and to feel better. Pt states that the last time he went through this was last year. Pt states that he has VH of seeing black things. Pt currently denies SI, HI, AH and alcohol/drug use at this time.  How Long Has This Been Causing You Problems? <Week  Have You Recently Had Any Thoughts About Hurting Yourself? No  Are You Planning to Commit Suicide/Harm Yourself At This time? No  Have you Recently Had Thoughts About Hurting Someone Karolee Ohs? No  Are You Planning To Harm Someone At This Time? No  Physical Abuse Denies  Verbal Abuse Denies  Sexual Abuse Denies  Exploitation of patient/patient's resources Denies  Self-Neglect Denies  Are you currently experiencing any auditory, visual or other hallucinations? Yes  Please explain the hallucinations you are currently experiencing: Visual - seeing black things  Have You Used Any Alcohol or Drugs in the Past 24 Hours? No  Do you have any current medical co-morbidities that require immediate attention? No  Clinician description of patient physical appearance/behavior: cooperative, no shoes, blanket around shoulders  What Do You Feel Would Help You the Most Today? Alcohol or Drug Use Treatment  If access to [facility] was not available, would you have sought care in the Emergency Department? No  Determination of Need Routine (7 days)  Options For Referral Medication Management;Facility-Based Crisis;Outpatient Therapy

## 2023-12-17 ENCOUNTER — Telehealth: Payer: Self-pay | Admitting: Surgery

## 2023-12-17 ENCOUNTER — Telehealth (HOSPITAL_COMMUNITY): Payer: Self-pay | Admitting: Emergency Medicine

## 2023-12-17 MED ORDER — BUPRENORPHINE HCL-NALOXONE HCL 2-0.5 MG SL FILM: ORAL_FILM | SUBLINGUAL | 0 refills | Status: AC

## 2023-12-17 NOTE — Telephone Encounter (Signed)
 Subxone sent to different phamarcy.  This medication prescribed by Dr. Charlotta Newton pharmacy did not have it in stock so I resent it to a different pharmacy that had it.

## 2023-12-17 NOTE — Telephone Encounter (Signed)
 Received call  from patient and mother they report that the [facility] where his prescription was sent is out of stock, he is requesting,  it be sent to [facility] on [address] Discussed with Dr. Lockie Mola prescription sent in. Updated patient.  No further EDCM needs identified

## 2024-10-06 ENCOUNTER — Encounter (HOSPITAL_COMMUNITY): Payer: Self-pay | Admitting: Emergency Medicine

## 2024-10-06 ENCOUNTER — Ambulatory Visit (HOSPITAL_COMMUNITY)
Admission: EM | Admit: 2024-10-06 | Discharge: 2024-10-06 | Disposition: A | Discharge status: Home / Self Care | Attending: Internal Medicine | Admitting: Internal Medicine

## 2024-10-06 DIAGNOSIS — R0981 Nasal congestion: Secondary | ICD-10-CM

## 2024-10-06 DIAGNOSIS — J029 Acute pharyngitis, unspecified: Secondary | ICD-10-CM | POA: Diagnosis not present

## 2024-10-06 LAB — POCT INFLUENZA A/B
Influenza A, POC: NEGATIVE
Influenza B, POC: NEGATIVE

## 2024-10-06 LAB — POCT RAPID STREP A (OFFICE): Rapid Strep A Screen: NEGATIVE

## 2024-10-06 MED ORDER — LIDOCAINE VISCOUS HCL 2 % MT SOLN: 15.0000 mL | Freq: Four times a day (QID) | OROMUCOSAL | 0 refills | Status: AC | PRN

## 2024-10-06 MED ORDER — LIDOCAINE VISCOUS HCL 2 % MT SOLN
OROMUCOSAL | Status: AC
  Filled 2024-10-06: qty 15

## 2024-10-06 MED ORDER — IBUPROFEN 800 MG PO TABS: 800.0000 mg | ORAL_TABLET | Freq: Three times a day (TID) | ORAL | 0 refills | Status: AC | PRN

## 2024-10-06 MED ORDER — LIDOCAINE VISCOUS HCL 2 % MT SOLN
15.0000 mL | Freq: Once | OROMUCOSAL | Status: AC
  Administered 2024-10-06: 15 mL via OROMUCOSAL

## 2024-10-06 NOTE — ED Triage Notes (Signed)
 Pt reports woke up with sore throat yesterday. Today having green congestion. Had Tylenol  at 3am today.

## 2024-10-06 NOTE — Discharge Instructions (Addendum)
 Strep testing is negative, flu A and flu B is negative.  Symptoms, physical exam findings and duration of symptoms is most consistent with a viral pharyngitis. This does not require antibiotic treatment. We can try medication to help with the pain and inflammation. If symptoms persist for more then 5-7 days, then would need to return for re-evaluation. Lidocaine  15 mLs every 6 hours as needed for mouth/throat pain. Gargle for 20-30 seconds then spit out. Ibuprofen  800 mg every 8 hours as needed for pain Can also try over the counter lozenges for sore throat. Make sure to stay hydrated by drinking plenty of water . Return to urgent care or PCP if symptoms worsen or fail to resolve.

## 2024-10-06 NOTE — ED Provider Notes (Signed)
 " [facility]    CSN: [id number] Arrival date & time: 10/06/24  1411      History   Chief Complaint Chief Complaint  Patient presents with   Sore Throat   Nasal Congestion    HPI Jason Benjamin is a 28 y.o. male.   28 year old male who presents urgent care with complaints of sore throat and nasal congestion.  He reports his symptoms started yesterday.  The soreness in his throat is especially significant at the top part of his throat and the roof of his mouth.  He is also having nasal congestion with the left side of his nostril completely occluded.  He has not had any fevers, body aches, headaches, nausea, vomiting.  He took Tylenol  last night but has not had anything today. He reports the sore throat and back of the mouth pain is making it hard for him to eat anything.    Sore Throat Pertinent negatives include no chest pain, no abdominal pain and no shortness of breath.    Past Medical History:  Diagnosis Date   ADHD (attention deficit hyperactivity disorder)    Depression    Knee pain, acute 03/27/2011   Treated at [facility] with PT 7/5-9/19/12    Osgood-Schlatter's disease 05/15/2011    Patient Active Problem List   Diagnosis Date Noted   Viral syndrome 04/30/2014   Cough 04/30/2014   Foot callus 03/26/2014   Dysuria 03/26/2014   Lesion of right earlobe 10/23/2013   Allergic rhinitis 01/22/2013   Obesity 06/09/2012   Oppositional defiant disorder 11/25/2010   Adjustment disorder with anxiety 11/25/2010   Depressive disorder 09/09/2010   ADHD (attention deficit hyperactivity disorder), 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 No pertinent surgical history.     Home Medications    Prior to Admission medications  Medication Sig Start Date End Date Taking? Authorizing Provider  ibuprofen  (ADVIL ) 800 MG tablet Take 1 tablet (800 mg total) by mouth every 8 (eight) hours as  needed for moderate pain (pain score 4-6). 10/06/24  Yes Iqra Rotundo A, PA-C  lidocaine  (XYLOCAINE ) 2 % solution Use as directed 15 mLs in the mouth or throat every 6 (six) hours as needed for mouth pain. Gargle for 20-30 seconds then spit out 10/06/24  Yes Teresa Almarie LABOR, PA-C    Family History Family History  Problem Relation Age of Onset   Hypertension Mother     Social History Social History[1]   Allergies   Patient has no known allergies.   Review of Systems Review of Systems  Constitutional:  Negative for chills and fever.  HENT:  Positive for congestion and sore throat. Negative for ear pain.   Eyes:  Negative for pain and visual disturbance.  Respiratory:  Negative for cough and shortness of breath.   Cardiovascular:  Negative for chest pain and palpitations.  Gastrointestinal:  Negative for abdominal pain and vomiting.  Genitourinary:  Negative for dysuria and hematuria.  Musculoskeletal:  Negative for arthralgias and back pain.  Skin:  Negative for color change and rash.  Neurological:  Negative for seizures and syncope.  All other systems reviewed and are negative.    Physical Exam Triage Vital Signs ED Triage Vitals [10/06/24 1640]  Encounter Vitals Group     BP 133/89     Girls Systolic BP Percentile      Girls Diastolic BP Percentile      Boys Systolic BP  Percentile      Boys Diastolic BP Percentile      Pulse Rate 63     Resp 15     Temp 98.2 F (36.8 C)     Temp Source Oral     SpO2 98 %     Weight      Height      Head Circumference      Peak Flow      Pain Score 9     Pain Loc      Pain Education      Exclude from Growth Chart    No data found.  Updated Vital Signs BP 133/89 (BP Location: Right Arm)   Pulse 63   Temp 98.2 F (36.8 C) (Oral)   Resp 15   SpO2 98%   Visual Acuity Right Eye Distance:   Left Eye Distance:   Bilateral Distance:    Right Eye Near:   Left Eye Near:    Bilateral Near:     Physical  Exam Vitals and nursing note reviewed.  Constitutional:      General: He is not in acute distress.    Appearance: He is well-developed.  HENT:     Head: Normocephalic and atraumatic.     Right Ear: Tympanic membrane normal.     Left Ear: Tympanic membrane normal.     Nose: Congestion present.     Mouth/Throat:     Mouth: Mucous membranes are moist. No oral lesions.     Pharynx: Uvula midline. Posterior oropharyngeal erythema present. No pharyngeal swelling or oropharyngeal exudate.  Eyes:     Conjunctiva/sclera: Conjunctivae normal.  Cardiovascular:     Rate and Rhythm: Normal rate and regular rhythm.     Heart sounds: No murmur heard. Pulmonary:     Effort: Pulmonary effort is normal. No respiratory distress.     Breath sounds: Normal breath sounds.  Abdominal:     Palpations: Abdomen is soft.     Tenderness: There is no abdominal tenderness.  Musculoskeletal:        General: No swelling.     Cervical back: Neck supple.  Skin:    General: Skin is warm and dry.     Capillary Refill: Capillary refill takes less than 2 seconds.  Neurological:     Mental Status: He is alert.  Psychiatric:        Mood and Affect: Mood normal.      UC Treatments / Results  Labs (all labs ordered are listed, but only abnormal results are displayed) Labs Reviewed  POCT RAPID STREP A (OFFICE)  POCT INFLUENZA A/B    EKG   Radiology No results found.  Procedures Procedures (including critical care time)  Medications Ordered in UC Medications - No data to display  Initial Impression / Assessment and Plan / UC Course  I have reviewed the triage vital signs and the nursing notes.  Pertinent labs & imaging results that were available during my care of the patient were reviewed by me and considered in my medical decision making (see chart for details).     Sore throat - Plan: POC rapid strep A, POC Influenza A/B, POC rapid strep A, POC Influenza A/B  Nasal congestion - Plan: POC  Influenza A/B, POC Influenza A/B  Viral pharyngitis   Strep testing is negative, flu A and flu B is negative.  Symptoms, physical exam findings and duration of symptoms is most consistent with a viral pharyngitis. This does not require antibiotic  treatment. We can try medication to help with the pain and inflammation. If symptoms persist for more then 5-7 days, then would need to return for re-evaluation. Lidocaine  15 mLs every 6 hours as needed for mouth/throat pain. Gargle for 20-30 seconds then spit out. Ibuprofen  800 mg every 8 hours as needed for pain Can also try over the counter lozenges for sore throat. Make sure to stay hydrated by drinking plenty of water . Return to urgent care or PCP if symptoms worsen or fail to resolve.    Final Clinical Impressions(s) / UC Diagnoses   Final diagnoses:  Sore throat  Nasal congestion  Viral pharyngitis     Discharge Instructions      Strep testing is negative, flu A and flu B is negative.  Symptoms, physical exam findings and duration of symptoms is most consistent with a viral pharyngitis. This does not require antibiotic treatment. We can try medication to help with the pain and inflammation. If symptoms persist for more then 5-7 days, then would need to return for re-evaluation. Lidocaine  15 mLs every 6 hours as needed for mouth/throat pain. Gargle for 20-30 seconds then spit out. Ibuprofen  800 mg every 8 hours as needed for pain Can also try over the counter lozenges for sore throat. Make sure to stay hydrated by drinking plenty of water . Return to urgent care or PCP if symptoms worsen or fail to resolve.       ED Prescriptions     Medication Sig Dispense Auth. Provider   lidocaine  (XYLOCAINE ) 2 % solution Use as directed 15 mLs in the mouth or throat every 6 (six) hours as needed for mouth pain. Gargle for 20-30 seconds then spit out 100 mL Pallavi Clifton A, PA-C   ibuprofen  (ADVIL ) 800 MG tablet Take 1 tablet (800 mg total) by  mouth every 8 (eight) hours as needed for moderate pain (pain score 4-6). 25 tablet Teresa Almarie LABOR, PA-C      PDMP not reviewed this encounter.    [1]  Social History Tobacco Use   Smoking status: Every Day   Smokeless tobacco: Never  Vaping Use   Vaping status: Never Used  Substance Use Topics   Alcohol use: Not Currently   Drug use: Yes    Types: Marijuana     Teresa Almarie LABOR, PA-C 10/06/24 1732  "
